# Patient Record
Sex: Male | Born: 1965 | Hispanic: No | Marital: Married | Smoking: Never smoker
Health system: Southern US, Community
[De-identification: ages and names within clinical notes are randomized; demographics above are authoritative.]

## PROBLEM LIST (undated history)

## (undated) DIAGNOSIS — R7303 Prediabetes: Principal | ICD-10-CM

## (undated) DIAGNOSIS — E785 Hyperlipidemia, unspecified: Secondary | ICD-10-CM

## (undated) DIAGNOSIS — J3089 Other allergic rhinitis: Secondary | ICD-10-CM

## (undated) DIAGNOSIS — K5790 Diverticulosis of intestine, part unspecified, without perforation or abscess without bleeding: Secondary | ICD-10-CM

## (undated) DIAGNOSIS — K648 Other hemorrhoids: Secondary | ICD-10-CM

## (undated) DIAGNOSIS — E669 Obesity, unspecified: Secondary | ICD-10-CM

## (undated) DIAGNOSIS — L209 Atopic dermatitis, unspecified: Secondary | ICD-10-CM

## (undated) HISTORY — DX: Other allergic rhinitis: J30.89

## (undated) HISTORY — DX: Hyperlipidemia, unspecified: E78.5

## (undated) HISTORY — DX: Other hemorrhoids: K64.8

## (undated) HISTORY — DX: Obesity, unspecified: E66.9

## (undated) HISTORY — DX: Atopic dermatitis, unspecified: L20.9

## (undated) HISTORY — DX: Prediabetes: R73.03

## (undated) HISTORY — DX: Diverticulosis of intestine, part unspecified, without perforation or abscess without bleeding: K57.90

---

## 2003-09-07 ENCOUNTER — Encounter: Admission: RE | Admit: 2003-09-07 | Discharge: 2003-09-07 | Payer: Self-pay | Admitting: Family Medicine

## 2003-11-09 ENCOUNTER — Ambulatory Visit: Payer: Self-pay | Admitting: Family Medicine

## 2011-02-07 DIAGNOSIS — R7303 Prediabetes: Secondary | ICD-10-CM

## 2011-02-07 HISTORY — DX: Prediabetes: R73.03

## 2011-08-01 ENCOUNTER — Encounter (HOSPITAL_COMMUNITY): Payer: Self-pay | Admitting: Emergency Medicine

## 2011-08-01 ENCOUNTER — Emergency Department (HOSPITAL_COMMUNITY)
Admission: EM | Admit: 2011-08-01 | Discharge: 2011-08-02 | Disposition: A | Discharge status: Home / Self Care | Payer: Self-pay | Attending: Emergency Medicine | Admitting: Emergency Medicine

## 2011-08-01 DIAGNOSIS — H5789 Other specified disorders of eye and adnexa: Secondary | ICD-10-CM | POA: Insufficient documentation

## 2011-08-01 DIAGNOSIS — T7840XA Allergy, unspecified, initial encounter: Secondary | ICD-10-CM | POA: Insufficient documentation

## 2011-08-01 DIAGNOSIS — L299 Pruritus, unspecified: Secondary | ICD-10-CM | POA: Insufficient documentation

## 2011-08-01 DIAGNOSIS — I10 Essential (primary) hypertension: Secondary | ICD-10-CM | POA: Insufficient documentation

## 2011-08-01 MED ORDER — DIPHENHYDRAMINE HCL 50 MG/ML IJ SOLN
50.0000 mg | Freq: Once | INTRAMUSCULAR | Status: AC
  Administered 2011-08-01: 50 mg via INTRAVENOUS
  Filled 2011-08-01: qty 1

## 2011-08-01 MED ORDER — EPINEPHRINE 0.3 MG/0.3ML IJ DEVI: 0.3000 mg | Freq: Once | INTRAMUSCULAR | Status: DC

## 2011-08-01 MED ORDER — FAMOTIDINE 20 MG PO TABS: 20.0000 mg | ORAL_TABLET | Freq: Two times a day (BID) | ORAL | Status: DC

## 2011-08-01 MED ORDER — PREDNISONE 10 MG PO TABS: 20.0000 mg | ORAL_TABLET | Freq: Every day | ORAL | Status: DC

## 2011-08-01 MED ORDER — METHYLPREDNISOLONE SODIUM SUCC 125 MG IJ SOLR
125.0000 mg | Freq: Once | INTRAMUSCULAR | Status: AC
  Administered 2011-08-01: 125 mg via INTRAVENOUS
  Filled 2011-08-01: qty 2

## 2011-08-01 MED ORDER — FAMOTIDINE IN NACL 20-0.9 MG/50ML-% IV SOLN
20.0000 mg | Freq: Once | INTRAVENOUS | Status: AC
  Administered 2011-08-01: 20 mg via INTRAVENOUS
  Filled 2011-08-01: qty 50

## 2011-08-01 NOTE — ED Provider Notes (Signed)
History     CSN: [id number]  Arrival date & time 08/01/11  2203   First MD Initiated Contact with Patient 08/01/11 2220      Chief Complaint  Patient presents with  . Allergic Reaction    (Consider location/radiation/quality/duration/timing/severity/associated sxs/prior treatment) Patient is a 46 y.o. male presenting with allergic reaction. The history is provided by the patient.  Allergic Reaction The primary symptoms do not include wheezing, shortness of breath, vomiting, rash or urticaria. The current episode started 1 to 2 hours ago. The problem has been rapidly worsening. This is a new problem.  Significant symptoms also include eye redness and itching.  no meds used possible to arrival, nothing makes sx better, no prior h/o same  History reviewed. No pertinent past medical history.  History reviewed. No pertinent past surgical history.  History reviewed. No pertinent family history.  History  Substance Use Topics  . Smoking status: Never Smoker   . Smokeless tobacco: Not on file  . Alcohol Use: Yes      Review of Systems  Eyes: Positive for redness.  Respiratory: Negative for shortness of breath and wheezing.   Gastrointestinal: Negative for vomiting.  Skin: Positive for itching. Negative for rash.  All other systems reviewed and are negative.    Allergies  Review of patient's allergies indicates no known allergies.  Home Medications   Current Outpatient Rx  Name Route Sig Dispense Refill  . ASPIRIN 325 MG PO TABS Oral Take 325 mg by mouth as needed.      BP 129/86  Pulse 71  Temp 98 F (36.7 C) (Oral)  Resp 16  SpO2 97%  Physical Exam  Nursing note and vitals reviewed. Constitutional: He is oriented to person, place, and time. He appears well-developed and well-nourished.  Non-toxic appearance. No distress.  HENT:  Head: Normocephalic and atraumatic.  Eyes: Conjunctivae, EOM and lids are normal. Pupils are equal, round, and reactive to light.         Periorbital edema  Neck: Normal range of motion. Neck supple. No tracheal deviation present. No mass present.  Cardiovascular: Normal rate, regular rhythm and normal heart sounds.  Exam reveals no gallop.   No murmur heard. Pulmonary/Chest: Effort normal and breath sounds normal. No stridor. No respiratory distress. He has no decreased breath sounds. He has no wheezes. He has no rhonchi. He has no rales.  Abdominal: Soft. Normal appearance and bowel sounds are normal. He exhibits no distension. There is no tenderness. There is no rebound and no CVA tenderness.  Musculoskeletal: Normal range of motion. He exhibits no edema and no tenderness.  Neurological: He is alert and oriented to person, place, and time. He has normal strength. No cranial nerve deficit or sensory deficit. GCS eye subscore is 4. GCS verbal subscore is 5. GCS motor subscore is 6.  Skin: Skin is warm and dry. No abrasion and no rash noted.  Psychiatric: He has a normal mood and affect. His speech is normal and behavior is normal.    ED Course  Procedures (including critical care time)  Labs Reviewed - No data to display No results found.   No diagnosis found.    MDM  Pt given benadryl , pepcid, steroids--eye swelling improved, will monitor and dr. Norlene Campbell to dispo  CRITICAL CARE Performed by: Toy Baker   Total critical care time: 60  Critical care time was exclusive of separately billable procedures and treating other patients.  Critical care was necessary to treat or prevent imminent  or life-threatening deterioration.  Critical care was time spent personally by me on the following activities: development of treatment plan with patient and/or surrogate as well as nursing, discussions with consultants, evaluation of patient's response to treatment, examination of patient, obtaining history from patient or surrogate, ordering and performing treatments and interventions, ordering and review of laboratory  studies, ordering and review of radiographic studies, pulse oximetry and re-evaluation of patient's condition.         Toy Baker, MD 08/01/11 [phone]

## 2011-08-01 NOTE — ED Notes (Signed)
Pt states he is having a possible allergic reaction to something  Pt states his sxs started about an hour ago  Pt has swelling noted to his eyelids and eyes, states he feels short of breath, hands and feet numb and tingling

## 2011-08-01 NOTE — ED Notes (Signed)
Pt resting in bed; no s/s of distress noted. Pt denies Pain at this time.

## 2011-08-01 NOTE — Discharge Instructions (Signed)
Reaccin alrgica - Leve a moderada (Allergic Reaction, Mild to Moderate) Las alergias pueden ser ocasionadas por cualquier cosa a la que su organismo sea sensible. Pueden ser alimentos, medicamentos, polen, sustancias qumicas y casi cualquiera de las cosas que lo rodean en su vida diaria que producen alrgenos. Un alrgeno es todo lo que hace que una sustancia produzca alergia. Esto hace que el organismo libere anticuerpos. A travs de una cadena de eventos, estos finalmente hacen que usted libere histamina en el torrente sanguneo. La funcin de la histamina es protegerlo, pero tambin puede causar [place]. Es por eso que en el caso de las alergias se [employer] antihistamnicos. La herencia es uno de los factores que interviene en las Therapist, art. Esto significa que usted puede sufrir alguna de las alergias que sufrieron sus [place]. Las eBay ocurrir a Actuary. Usted puede tener cierta idea de lo que ha causado la reaccin. Estamos rodeados por gran cantidad de alrgenos. Puede resultar difcil saber que caus su reaccin. Si este es Engineer, mining ataque, puede ser que no le vuelva a Radiation protection practitioner. Las alergias no pueden curarse pero pueden controlarse con medicamentos. SNTOMAS Una alergia puede causarle alguno o todos los siguientes sntomas.   Hinchazn y prurito en el interior de la boca y alrededor de la misma.   Lagrimeo y [employer] ojos.   Congestin nasal y goteo nasal.   Estornudos y tos.   Una erupcin roja que produce picazn o urticaria.   Vmitos o diarrea.   Dificultad para respirar.  Las Omnicom pueden ocurrir a Actuary. Se denominan as porque generalmente se producen durante la misma estacin todos los aos. Puede ser Neomia Dear reaccin al moho, al polen del csped o al polen de los rboles. Otras causas del problema son los alrgenos que contienen los caros del polvo del hogar, el pelaje de las mascotas y las esporas del moho. Estos  son slo algunos de los ms comunes de [employer] cientos de alrgenos que nos rodean. Todos estos sntomas aparecen cuando se entra en contacto con el polen y otros alrgenos. Estas alergias estacionales generalmente no ponen en peligro la vida. Generalmente se trata de una incomodidad que puede aliviarse con medicamentos. La fiebre del heno es una combinacin de todos o algunos de los problemas alrgicos enumerados. Simplemente se tratan con medicamentos de venta libre como difenhidramina (Benadryl). Tome los medicamentos segn las indicaciones. Consulte con el profesional que lo asiste o siga las instrucciones de uso para las dosis para nios. TRATAMIENTO Y CUIDADO DOMICILIARIO Si presenta urticaria o una erupcin cutnea:  Tome los medicamentos como se le indic.   Puede utilizar un antihistamnico de venta libre (difenhidramina) para la urticaria y Higher education careers adviser, segn sea necesario. No conduzca ni beba alcohol hasta que desaparezca el efecto de los medicamentos para Music therapist. Los antihistamnicos causan somnolencia.   Aplquese compresas fras sobre la piel o tome baos de agua fra. Ayuda a calmar la picazn. Evite los baos o las duchas calientes. El calor puede hacer que la urticaria y la picazn empeoren.   Si las Medical sales representative y se hacen ms graves, y los medicamentos de venta libre no son Geologist, engineering, existen muchos medicamentos nuevos que el mdico le puede prescribir Otros tratamientos como la inmunoterapia o las inyecciones desensibilizantes pueden utilizarse si todos estos fracasan. Haga una consulta de seguimiento con el profesional que lo asiste si los problemas continan.  SOLICITE ANTENCIN MDICA SI:  Las alergias se tornan cada  vez ms graves.   Sospecha que puede sufrir una alergia a Environmental manager. Los sntomas generalmente ocurren dentro de los 30 minutos posteriores a haber ingerido el alimento.   Los sntomas persistieron durante [address] o han empeorado.    Desarrolla nuevos sntomas.   Quiere volver a probar o que su hijo consuma nuevamente un alimento o bebida que usted cree que le causa una reaccin Counselling psychologist. Nunca realice una prueba en usted mismo, ni en su nio, si sospecha una alergia, si no se encuentra bajo la observacin de su mdico. Neomia Dear segunda exposicin a un alrgeno puede poner en peligro su vida.  SOLICITE ATENCIN MDICA DE INMEDIATO SI PRESENTA:  Dificultad para respirar, respiracin ruidosa o siente una sensacin de opresin en el pecho o en la garganta.   Presenta sarpullido, hinchazn o picazn en todo el cuerpo.  Una reaccin grave con cualquiera de los problemas mencionados debe considerarse como peligrosa para la vida. Si presenta dificultad para respirar de manera sbita, comunquese con el servicio de emergencias y solicite asistencia mdica. ESTO ES UNA EMERGENCIA. ASEGRESE QUE:   Comprende estas instrucciones.   Controlar su enfermedad.   Solicitar ayuda inmediatamente si no mejora o si empeora.  Document Released: 04/21/2008 Document Revised: 01/12/2011 [facility] Patient Information 2012 [place], [place].Inyeccin de epinefrina (Epinephrine Injection) La epinefrina es un medicamento que se administra de manera inyectable para el tratamiento de emergencia de Runner, broadcasting/film/video. Tambin se [place] para tratar los ataques de asma graves y otros problemas pulmonares. El medicamento ayuda a Physiological scientist (Clinical biochemist) las vas respiratorias pequeas de los pulmones. Una reaccin alrgica sbita que pone en peligro la vida y que involucra todo el cuerpo se llama anafilaxis. Debido a los McDonald[employer] Corporation, la epinefrina slo debe usarse segn las indicaciones del mdico.  RIESGOS Y COMPLICACIONES  Los efectos adversos posibles de la epinefrina son:   Journalist, newspaper.   Frecuencia cardaca rpida o irregular.   Falta de aire.   Nuseas.   Vmitos.   Dolor o clicos abdominales.   Sudoracin.   Mareos.    Debilidad.   Dolor de [place].   Nerviosismo.  Informe al mdico todos estos efectos secundarios.  CMO APLICAR LA INYECCIN DE EPINEFRINA  Administre la inyeccin de epinefrina de inmediato cuando comiencen los sntomas de una reaccin grave. El medicamento se inyecta en la cara externa del muslo o en cualquier msculo grande disponible. Su mdico podr ensearle cmo hacerlo. No es necesario que se quite la ropa. Despus de la inyeccin, llame a los servicios de emergencias (911 en los [place]). Aunque haya mejorado luego de la inyeccin, deber examinarse en el departamento de emergencias del hospital. La epinefrina acta rpidamente, pero tambin desaparece rpidamente. Pueden ocurrir reacciones tardas. Una reaccin retardada puede ser tan grave y peligrosa como la reaccin inicial.  INSTRUCCIONES PARA EL CUIDADO EN EL HOGAR   Asegrese de que usted y su familia saben cmo administrar una inyeccin de epinefrina.   Use un antihistamnico para evitar la picazn segn las indicaciones del mdico. No lo use con ms frecuencia ni en dosis mayores que las prescritas.   Lleve siempre la inyeccin de epinefrina o el kit de anafilaxis con usted. Esto podr salvarle la vida si tiene una reaccin grave.   Guarde el Careers adviser fresco y seco. Si cambia el color o est turbio deschelo de forma [place] y sustityalo por uno nuevo.   Verifique la fecha de vencimiento. Puede ser [place] medicamentos  ms all de su fecha de caducidad.   Comunque al mdico cualquier otro medicamento que est tomando. Algunos medicamentos pueden tener una reaccin adversa si se administran junto con la epinefrina.   Informe a su mdico [employer] sufre como diabetes, presin arterial alta (hipertensin), enfermedades del corazn, latidos cardacos irregulares, o si est embarazada.  SOLICITE ATENCIN MDICA DE INMEDIATO SI:   Se ha aplicado una inyeccin de epinefrina.  Comunquese inmediatamente con el servicio de urgencias de su localidad (911 en los [place]). Aunque haya mejorado luego de la inyeccin, deber examinarse en el departamento de emergencias del hospital para estar seguro de que la reaccin alrgica est bajo control. Tambin le [employer] adversos que pueda tener por el [place].   Siente dolor en el pecho.   La frecuencia cardaca est acelerada o es irregular.   Le falta el aire.   Siente dolor de cabeza intenso.   Tiene nuseas, vmitos o dolor abdominal intensos.   Siente mucho dolor, observa inflamacin o hinchazn en el sitio de la inyeccin.  Document Released: 01/23/2005 Document Revised: 01/12/2011 [facility] Patient Information 2012 [place], [place].

## 2015-06-07 ENCOUNTER — Encounter: Payer: Self-pay | Admitting: Internal Medicine

## 2015-06-07 ENCOUNTER — Ambulatory Visit (INDEPENDENT_AMBULATORY_CARE_PROVIDER_SITE_OTHER): Payer: Self-pay | Admitting: Internal Medicine

## 2015-06-07 VITALS — BP 112/72 | HR 56 | Resp 18 | Ht 63.0 in | Wt 174.0 lb

## 2015-06-07 DIAGNOSIS — R7303 Prediabetes: Secondary | ICD-10-CM

## 2015-06-07 NOTE — Patient Instructions (Signed)
When you wash your hands, reapply Triamcinolone Cream to rash and Eucerin cream with eczema relief all over hands.  You should use the Triamcinolone cream and eucerin cream twice daily no matter what while you have the rash.  Also recommend the last time you use your hand creams at night, you put the cotton gloves over your hands and sleep with them on.  Recommend wearing the cotton gloves under the nitrile gloves while you clean.

## 2015-06-07 NOTE — Progress Notes (Signed)
   Subjective:    Patient ID: Dennis Booth, male    DOB: 10/25/1965, 50 y.o.   MRN: [id number]  HPI   1.  Flaking and thickening of skin of bilateral hands.  Left hand with involvement between 4th and 5th MCPs and dorsal aspect of web between thmb and index finger on right.  Has had for years.  Has been treated with Triamcinolone ointment and Fluocinonide cream.  Did not help at all.   Works in Pensions consultantcleaning service for businesses.  Wears latex gloves. Uses chemicals to clean, but states he does not get his hands in these chemicals.  Has had rash on hands for 4 years.  Itches.   Uses unknown soap.  Then applies hand sanitizer.  Then pulls latex gloves over his hands.    No current outpatient prescriptions on file.   Allergies  Allergen Reactions  . Fruit & Vegetable Daily [Nutritional Supplements]     States when he eats any type of fruit and takes Tylenol at same time, gets irritation in his throat--drinks milk and gets better, but was prescribed Epipen for this in past--not clear why.    Past Medical History  Diagnosis Date  . Prediabetes 2013    No past surgical history on file.   Family History  Problem Relation Age of Onset  . Diabetes Mother   . Diabetes Father     Social History   Social History  . Marital Status: Legally Separated    Spouse Name: N/A  . Number of Children: 4  . Years of Education: 7   Occupational History  . Cleaning crew for businesses    Social History Main Topics  . Smoking status: Never Smoker   . Smokeless tobacco: Never Used  . Alcohol Use: 0.0 oz/week    0 Standard drinks or equivalent per week     Comment: Has not had any alcohol for 3 years.  . Drug Use: No  . Sexual Activity: Not on file   Other Topics Concern  . Not on file   Social History Narrative   Originally from [place]   [place]   Lives by himself.   Children still at home live with their mother.        Review of Systems     Objective:   Physical Exam Lungs: CTA CV:  RRR without murmur or rub, occasional extrasystole, radial pulses normal and equal Skin:  Dorsi of bilateral hands thickened and scattered flaking. Large patch on left hand between MCPs of 4th and 5th digits, and in web between thumb and index finger on right.  Patches thickened and scaly.  Mild erythema and scaling at antecubital fossa on left.       Assessment & Plan:  1.  Dyshidrotic Eczema:  To use Dove or similar soap, no alcohol to skin.  Apply Triamcinolone Cream to patches at least twice daily or when washes hands, then Eucerin Cream all over hands. To wear thin white cotton gloves over hands and under Nitrile gloves and avoid latex  To also wear thin white gloves over creams when goes to bed.  Return for CPE in 3 months.

## 2015-06-18 ENCOUNTER — Telehealth: Payer: Self-pay | Admitting: Internal Medicine

## 2015-06-18 NOTE — Telephone Encounter (Signed)
Patient called requesting refill for Triamcinolone Acetonide 0.1% ointment. [facility] [address]

## 2015-06-22 MED ORDER — TRIAMCINOLONE ACETONIDE 0.1 % EX CREA: 1.0000 "application " | TOPICAL_CREAM | Freq: Two times a day (BID) | CUTANEOUS | Status: DC

## 2015-06-23 NOTE — Telephone Encounter (Signed)
Patient notified

## 2015-06-24 ENCOUNTER — Other Ambulatory Visit: Payer: Self-pay | Admitting: Internal Medicine

## 2015-06-24 ENCOUNTER — Telehealth: Payer: Self-pay | Admitting: Internal Medicine

## 2015-06-24 MED ORDER — TRIAMCINOLONE ACETONIDE 0.1 % EX CREA: 1.0000 "application " | TOPICAL_CREAM | Freq: Two times a day (BID) | CUTANEOUS | Status: DC

## 2015-07-01 NOTE — Telephone Encounter (Signed)
Patient picked up Triamcinolone already.

## 2015-09-07 ENCOUNTER — Encounter: Payer: Self-pay | Admitting: Internal Medicine

## 2015-09-07 ENCOUNTER — Ambulatory Visit (INDEPENDENT_AMBULATORY_CARE_PROVIDER_SITE_OTHER): Payer: Self-pay | Admitting: Internal Medicine

## 2015-09-07 VITALS — BP 130/82 | HR 50 | Resp 16 | Ht 64.0 in | Wt 177.0 lb

## 2015-09-07 DIAGNOSIS — K029 Dental caries, unspecified: Secondary | ICD-10-CM

## 2015-09-07 DIAGNOSIS — Z0000 Encounter for general adult medical examination without abnormal findings: Secondary | ICD-10-CM

## 2015-09-07 DIAGNOSIS — R7303 Prediabetes: Secondary | ICD-10-CM

## 2015-09-07 DIAGNOSIS — L301 Dyshidrosis [pompholyx]: Secondary | ICD-10-CM

## 2015-09-07 LAB — GLUCOSE, POCT (MANUAL RESULT ENTRY): POC Glucose: 78 mg/dl (ref 70–99)

## 2015-09-07 NOTE — Patient Instructions (Addendum)
Call the clinic if you do not hear from dental or dermatology in next month Call in September for Influenza and Tdap vaccines

## 2015-09-07 NOTE — Progress Notes (Signed)
Subjective:    Patient ID: Dennis Booth, male    DOB: 08-Jun-1965, 50 y.o.   MRN: [id number]  HPI   Here for CPE  Health Maintenance:  1.  PSA:  Not sure if ever done.  No family history of prostate cancer.  No history of DRE.  2.  Guaiac Cards:  Never  3.  Colonoscopy:  Never  4.  STE:  Never  5.  Immunizations:  No history of influenza vaccine.  No Td in past 10 years.    Dishydrotic Eczema:  Did not get adequate response to using Triamcinolone cream and Eucerin cream twice daily with cotton gloves after applying at night. Went to [facility] clinic and received unknown topical that was quite expensive.  Did not help as well.  No exposure to water and cleaning chemicals now with job.   Current Outpatient Prescriptions:  .  triamcinolone cream (KENALOG) 0.1 %, Apply 1 application topically 2 (two) times daily., Disp: 80 g, Rfl: 0   Allergies  Allergen Reactions  . Fruit & Vegetable Daily [Nutritional Supplements]     States when he eats any type of fruit and takes Tylenol at same time, gets irritation in his throat--drinks milk and gets better, but was prescribed Epipen for this in past--not clear why.   Past Medical History:  Diagnosis Date  . Prediabetes 2013    No past surgical history on file.   Family History  Problem Relation Age of Onset  . Diabetes Mother   . Diabetes Father     Social History   Social History  . Marital status: Legally Separated    Spouse name: N/A  . Number of children: 4  . Years of education: 7   Occupational History  . Cleaning crew for businesses    Social History Main Topics  . Smoking status: Never Smoker  . Smokeless tobacco: Never Used  . Alcohol use 0.0 oz/week     Comment: Has not had any alcohol for 3 years.  . Drug use: No  . Sexual activity: Not on file   Other Topics Concern  . Not on file   Social History Narrative   Originally from [place]   [place]   Lives by himself.   Children still at home live with their mother.       Review of Systems  Constitutional: Negative for fatigue and fever.  HENT: Negative for ear pain, hearing loss, postnasal drip and sore throat.   Eyes:       Uses reading glasses--works well for him  Respiratory: Negative for chest tightness and shortness of breath.   Cardiovascular: Negative for chest pain, palpitations and leg swelling.  Gastrointestinal: Negative for abdominal pain, blood in stool, constipation, diarrhea and nausea.       No melena   Endocrine: Negative for polydipsia and polyuria.  Genitourinary: Negative for decreased urine volume, discharge, dysuria, frequency, penile pain, scrotal swelling and testicular pain.  Musculoskeletal: Negative for arthralgias, gait problem, joint swelling and myalgias.  Skin: Positive for rash.       dishydrotic eczema still a problem.  [facility] [address] and got another cream that was expensive that did not help.  Cannot recall the name.  Allergic/Immunologic: Positive for food allergies.       [place]  Neurological: Negative for dizziness, weakness, light-headedness, numbness and headaches.  Hematological: Negative for adenopathy. Does not bruise/bleed easily.  Psychiatric/Behavioral: Negative for dysphoric mood, sleep disturbance  and suicidal ideas. The patient is not nervous/anxious.        Objective:   Physical Exam  Constitutional: He is oriented to person, place, and time. He appears well-developed and well-nourished.  HENT:  Head: Normocephalic and atraumatic.  Right Ear: Hearing, tympanic membrane, external ear and ear canal normal.  Left Ear: Hearing, tympanic membrane, external ear and ear canal normal.  Nose: Nose normal.  Mouth/Throat: Uvula is midline, oropharynx is clear and moist and mucous membranes are normal.  Missing teeth.  Some decay  Eyes: Conjunctivae and EOM are normal. Pupils are equal, round, and reactive to light.  Discs sharp  bilaterally  Neck: Normal range of motion and full passive range of motion without pain. Neck supple. No thyroid mass and no thyromegaly present.  Cardiovascular: Normal rate, regular rhythm, S1 normal, S2 normal, normal heart sounds and normal pulses.  Exam reveals no S3, no S4 and no friction rub.   No murmur heard. Pulmonary/Chest: Effort normal and breath sounds normal.  Abdominal: Soft. Bowel sounds are normal. He exhibits no mass. There is no hepatosplenomegaly. There is no tenderness. No hernia.  Genitourinary:  Genitourinary Comments: Patient refused genital and rectal/prostate exam  Musculoskeletal: Normal range of motion.  Lymphadenopathy:       Head (right side): No submental and no submandibular adenopathy present.       Head (left side): No submental and no submandibular adenopathy present.    He has no cervical adenopathy.    He has no axillary adenopathy.       Right: No inguinal and no supraclavicular adenopathy present.       Left: No inguinal and no supraclavicular adenopathy present.  Neurological: He is alert and oriented to person, place, and time. He has normal strength and normal reflexes. He displays normal reflexes. No cranial nerve deficit or sensory deficit. Coordination and gait normal.  Skin: Skin is warm and dry.  Dry flaking skin on dorsum of MCP area of 4th and 5th fingers of left hand and dorsum of 1st web and index MCP on right.  Hypopigmentation in same area.  Psychiatric: He has a normal mood and affect. His speech is normal and behavior is normal. Judgment and thought content normal.          Assessment & Plan:  1.  Annual Exam Patient refused genital and rectal/prostate eam Needs Tdap--out today and will have available shortly--will notify patient Recommend calling in September for Influenza vaccine CBC, CMP, FLP  2.  Prediabetes:  A1C  3.  Dental decay:  Dental referral  4.  Dishydrotic eczema:  Has not responded to topical treatments,  though I am not certain of what cream was prescribed at [facility] clinic.  Derm referral.

## 2015-09-08 LAB — COMPREHENSIVE METABOLIC PANEL
ALBUMIN: 4 g/dL (ref 3.5–5.5)
ALT: 22 IU/L (ref 0–44)
AST: 15 IU/L (ref 0–40)
Albumin/Globulin Ratio: 1.4 (ref 1.2–2.2)
Alkaline Phosphatase: 66 IU/L (ref 39–117)
BUN / CREAT RATIO: 16 (ref 9–20)
BUN: 12 mg/dL (ref 6–24)
Bilirubin Total: 0.3 mg/dL (ref 0.0–1.2)
CALCIUM: 8.9 mg/dL (ref 8.7–10.2)
CO2: 25 mmol/L (ref 18–29)
CREATININE: 0.75 mg/dL — AB (ref 0.76–1.27)
Chloride: 102 mmol/L (ref 96–106)
GFR calc Af Amer: 125 mL/min/{1.73_m2} (ref >59)
GFR, EST NON AFRICAN AMERICAN: 108 mL/min/{1.73_m2} (ref >59)
GLOBULIN, TOTAL: 2.8 g/dL (ref 1.5–4.5)
Glucose: 100 mg/dL — ABNORMAL HIGH (ref 65–99)
Potassium: 4.1 mmol/L (ref 3.5–5.2)
SODIUM: 140 mmol/L (ref 134–144)
TOTAL PROTEIN: 6.8 g/dL (ref 6.0–8.5)

## 2015-09-08 LAB — CBC WITH DIFFERENTIAL/PLATELET
BASOS: 0 %
Basophils Absolute: 0 10*3/uL (ref 0.0–0.2)
EOS (ABSOLUTE): 0.1 10*3/uL (ref 0.0–0.4)
EOS: 2 %
HEMATOCRIT: 41.5 % (ref 37.5–51.0)
Hemoglobin: 13.9 g/dL (ref 12.6–17.7)
IMMATURE GRANS (ABS): 0 10*3/uL (ref 0.0–0.1)
IMMATURE GRANULOCYTES: 0 %
LYMPHS: 36 %
Lymphocytes Absolute: 1.8 10*3/uL (ref 0.7–3.1)
MCH: 30.2 pg (ref 26.6–33.0)
MCHC: 33.5 g/dL (ref 31.5–35.7)
MCV: 90 fL (ref 79–97)
MONOS ABS: 0.4 10*3/uL (ref 0.1–0.9)
Monocytes: 7 %
NEUTROS PCT: 55 %
Neutrophils Absolute: 2.7 10*3/uL (ref 1.4–7.0)
PLATELETS: 209 10*3/uL (ref 150–379)
RBC: 4.6 x10E6/uL (ref 4.14–5.80)
RDW: 13.7 % (ref 12.3–15.4)
WBC: 5.1 10*3/uL (ref 3.4–10.8)

## 2015-09-08 LAB — LIPID PANEL W/O CHOL/HDL RATIO
Cholesterol, Total: 161 mg/dL (ref 100–199)
HDL: 50 mg/dL (ref >39)
LDL Calculated: 79 mg/dL (ref 0–99)
TRIGLYCERIDES: 161 mg/dL — AB (ref 0–149)
VLDL CHOLESTEROL CAL: 32 mg/dL (ref 5–40)

## 2015-09-08 LAB — HGB A1C W/O EAG: HEMOGLOBIN A1C: 5.7 % — AB (ref 4.8–5.6)

## 2015-09-15 ENCOUNTER — Ambulatory Visit (INDEPENDENT_AMBULATORY_CARE_PROVIDER_SITE_OTHER): Payer: Self-pay

## 2015-09-15 DIAGNOSIS — Z23 Encounter for immunization: Secondary | ICD-10-CM

## 2015-09-24 ENCOUNTER — Other Ambulatory Visit (INDEPENDENT_AMBULATORY_CARE_PROVIDER_SITE_OTHER): Payer: Self-pay | Admitting: Internal Medicine

## 2015-09-24 DIAGNOSIS — Z1211 Encounter for screening for malignant neoplasm of colon: Secondary | ICD-10-CM

## 2015-09-24 LAB — POC HEMOCCULT BLD/STL (HOME/3-CARD/SCREEN)
Card #3 Fecal Occult Blood, POC: NEGATIVE
FECAL OCCULT BLD: NEGATIVE
Fecal Occult Blood, POC: NEGATIVE

## 2015-09-24 NOTE — Progress Notes (Signed)
Patient stool cards resulted and sent to PCP

## 2015-11-23 ENCOUNTER — Encounter: Payer: Self-pay | Admitting: Internal Medicine

## 2016-03-31 ENCOUNTER — Ambulatory Visit (INDEPENDENT_AMBULATORY_CARE_PROVIDER_SITE_OTHER): Payer: Self-pay | Admitting: Internal Medicine

## 2016-03-31 ENCOUNTER — Encounter: Payer: Self-pay | Admitting: Internal Medicine

## 2016-03-31 VITALS — BP 126/72 | HR 70 | Resp 12 | Ht 63.0 in | Wt 180.0 lb

## 2016-03-31 DIAGNOSIS — K047 Periapical abscess without sinus: Secondary | ICD-10-CM

## 2016-03-31 DIAGNOSIS — K068 Other specified disorders of gingiva and edentulous alveolar ridge: Secondary | ICD-10-CM

## 2016-03-31 MED ORDER — PENICILLIN V POTASSIUM 250 MG PO TABS: 250.0000 mg | ORAL_TABLET | Freq: Four times a day (QID) | ORAL | 0 refills | Status: DC

## 2016-03-31 MED ORDER — IBUPROFEN 200 MG PO TABS: ORAL_TABLET | ORAL | 0 refills | Status: DC

## 2016-03-31 NOTE — Progress Notes (Signed)
   Subjective:    Patient ID: Janeal HolmesRene Pedraza-Hernandez, male    DOB: 03/15/1965, 51 y.o.   MRN: [id number]  HPI   Pain around tooth in left upper posterior jaw.  No definite drainage or swelling.  No fever Taking unknown OTC pain medication.  Current Meds  Medication Sig  . triamcinolone cream (KENALOG) 0.1 % Apply 1 application topically 2 (two) times daily.   Allergies  Allergen Reactions  . Fruit & Vegetable Daily [Nutritional Supplements]     States when he eats any type of fruit and takes Tylenol at same time, gets irritation in his throat--drinks milk and gets better, but was prescribed Epipen for this in past--not clear why.    Review of Systems     Objective:   Physical Exam  NAD Back two molars in left upper jaw with inflamed gingiva on palate side gingiva.  The second molar with almost ulceration and sharp recession of gingiva along palate side gingiva.  No fluctuance noted.  Buccal side gingiva with recession but not significant inflammation. Neck:  Supple, and without adenopathy Chest:  CTA CV:  RRR without murmur or rub, radial pulses normal and equal        Assessment & Plan:  Abscessed tooth, left upper posterior molars:  Concerned with the look of the gingiva on palate side associated with back two molars.   Start Pen V K 250 mg 4 times daily for 7 days and follow up in 1 week.   Will need dental referral as well.

## 2016-03-31 NOTE — Patient Instructions (Signed)
Anbesol --place on gum where pain is as needed

## 2016-04-07 ENCOUNTER — Encounter: Payer: Self-pay | Admitting: Internal Medicine

## 2016-04-07 ENCOUNTER — Ambulatory Visit (INDEPENDENT_AMBULATORY_CARE_PROVIDER_SITE_OTHER): Payer: Self-pay | Admitting: Internal Medicine

## 2016-04-07 VITALS — BP 112/72 | HR 68 | Resp 12 | Ht 63.0 in | Wt 180.0 lb

## 2016-04-07 DIAGNOSIS — K068 Other specified disorders of gingiva and edentulous alveolar ridge: Secondary | ICD-10-CM

## 2016-04-07 DIAGNOSIS — K047 Periapical abscess without sinus: Secondary | ICD-10-CM

## 2016-04-07 MED ORDER — CLINDAMYCIN HCL 150 MG PO CAPS: ORAL_CAPSULE | ORAL | 0 refills | Status: DC

## 2016-04-07 NOTE — Patient Instructions (Signed)
Avoid chewing on right side of mouth. Gargle with Listerine after each meal/snack

## 2016-04-07 NOTE — Progress Notes (Signed)
   Subjective:    Patient ID: Dennis HolmesRene Booth, male    DOB: 05/16/1965, 51 y.o.   MRN: [id number]  HPI   Here for follow up of abscessed tooth, left upper molars and ulcered, inflamed gingiva on palate side of teeth. Is on last day of Penicillin.  Took his last dose this morning.  States he is feeling much better--able to chew on the right again without pain.  No drainage from about the teeth.  Current Meds  Medication Sig  . ibuprofen (ADVIL,MOTRIN) 200 MG tablet 2-4 tabs by mouth every 6 hours with food for pain  . penicillin v potassium (VEETID) 250 MG tablet Take 1 tablet (250 mg total) by mouth 4 (four) times daily.  Marland Kitchen. triamcinolone cream (KENALOG) 0.1 % Apply 1 application topically 2 (two) times daily.   Allergies  Allergen Reactions  . Fruit & Vegetable Daily [Nutritional Supplements]     States when he eats any type of fruit and takes Tylenol at same time, gets irritation in his throat--drinks milk and gets better, but was prescribed Epipen for this in past--not clear why.    Review of Systems     Objective:   Physical Exam  NAD  Dental:  Two posterior right upper molars still with ulceration and what appears to be more inflammation of gingiva on palate side of teeth.  Pressure on palate just medial to teeth results in thick yellow pus expressed around medial second molar and some bleeding.  Patient states this is nontender and has no taste.  Unable to get swab of pus  NecK:  Supple, no adenopathy        Assessment & Plan:  Abscessed teeth, right upper molars:  Switch to Clindamycin 300 mg 3 times daily for 7 days.  To call if develops diarrhea.   Check on dental referral. Avoid chewing on right side

## 2016-04-14 ENCOUNTER — Ambulatory Visit (INDEPENDENT_AMBULATORY_CARE_PROVIDER_SITE_OTHER): Payer: Self-pay | Admitting: Internal Medicine

## 2016-04-14 ENCOUNTER — Encounter: Payer: Self-pay | Admitting: Internal Medicine

## 2016-04-14 VITALS — BP 118/80 | HR 70 | Resp 12 | Ht 63.0 in | Wt 179.0 lb

## 2016-04-14 DIAGNOSIS — K047 Periapical abscess without sinus: Secondary | ICD-10-CM

## 2016-04-14 DIAGNOSIS — H547 Unspecified visual loss: Secondary | ICD-10-CM

## 2016-04-14 MED ORDER — CLINDAMYCIN HCL 150 MG PO CAPS: ORAL_CAPSULE | ORAL | 0 refills | Status: DC

## 2016-04-14 NOTE — Patient Instructions (Signed)
Call clinic if develop diarrhea. Call if you don't hear from dental clinic next week.

## 2016-04-14 NOTE — Progress Notes (Signed)
   Subjective:    Patient ID: Dennis HolmesRene Booth, male    DOB: 07/01/1965, 51 y.o.   MRN: [id number]  HPI   Abscessed Teeth:  Has been on Clindamycin and states he notes a big difference from his last treatment.  Has not noted any drainage from about the left upper molars.  No fever.  Pain is resolved, which he stated despite the obvious infection last week. No diarrhea.     Also, decreased visual acuity, would like eye check to see about getting prescription glasses as readers not working for him as well as he would like.    Current Meds  Medication Sig  . clindamycin (CLEOCIN) 150 MG capsule 2 caps by mouth 3 times daily for 7 days.  Marland Kitchen. triamcinolone cream (KENALOG) 0.1 % Apply 1 application topically 2 (two) times daily.      Allergies  Allergen Reactions  . Fruit & Vegetable Daily [Nutritional Supplements]     States when he eats any type of fruit and takes Tylenol at same time, gets irritation in his throat--drinks milk and gets better, but was prescribed Epipen for this in past--not clear why.      Review of Systems     Objective:   Physical Exam   Swelling and fluctuance of left hard palate near left upper molars much decreased, but still with some pustular discharge with pressure of gingiva near involved teeth, along with scant bloody discharge.  Erythema of gingiva significantly decreased as well.  Eroded gingiva mildly improved.  Denies tenderness with the manipulation of the area as befofe.        Assessment & Plan:  1.  Abscessed Tooth:  Extend antibiotic treatment to 12 days with follow up next week.  May cancel follow up if gets into the dental clinic.  Call if develops diarrhea/abdominal pain  2. Decreased Visual Acuity:  Optometry referral okay with $69 plus eyeglass voucher

## 2016-04-20 ENCOUNTER — Ambulatory Visit (INDEPENDENT_AMBULATORY_CARE_PROVIDER_SITE_OTHER): Payer: Self-pay | Admitting: Internal Medicine

## 2016-04-20 ENCOUNTER — Encounter: Payer: Self-pay | Admitting: Internal Medicine

## 2016-04-20 VITALS — BP 122/72 | HR 60 | Resp 12 | Ht 63.0 in | Wt 179.0 lb

## 2016-04-20 DIAGNOSIS — K047 Periapical abscess without sinus: Secondary | ICD-10-CM

## 2016-04-20 NOTE — Progress Notes (Signed)
   Subjective:    Patient ID: Dennis HolmesRene Booth, male    DOB: 03/03/1965, 51 y.o.   MRN: [id number]  HPI  Tooth abscess:  Feels he is better.  Has 3 more days of Clindamycin. Has not noted any drainage of pus or blood.  No diarrhea.   Current Meds  Medication Sig  . clindamycin (CLEOCIN) 150 MG capsule 2 caps by mouth 3 times daily for additional 5 more days.  Marland Kitchen. triamcinolone cream (KENALOG) 0.1 % Apply 1 application topically 2 (two) times daily.   Allergies  Allergen Reactions  . Fruit & Vegetable Daily [Nutritional Supplements]     States when he eats any type of fruit and takes Tylenol at same time, gets irritation in his throat--drinks milk and gets better, but was prescribed Epipen for this in past--not clear why.    Review of Systems     Objective:   Physical Exam  Erythema of gingiva around back two upper right molars is much less.  A small area on the palate side of these molars when pressed produces a scant amount of blood and pus--much less than before.  NT.        Assessment & Plan:  Abscessed right upper molars:  Asked him to start flossing, especially about the two teeth in question.  Needs Dental Clinic evaluation and management.  To call if worsens.

## 2016-07-27 ENCOUNTER — Telehealth: Payer: Self-pay | Admitting: Internal Medicine

## 2016-07-27 NOTE — Telephone Encounter (Signed)
Please call and schedule for monday

## 2016-07-31 ENCOUNTER — Ambulatory Visit (INDEPENDENT_AMBULATORY_CARE_PROVIDER_SITE_OTHER): Payer: Self-pay | Admitting: Internal Medicine

## 2016-07-31 ENCOUNTER — Encounter: Payer: Self-pay | Admitting: Internal Medicine

## 2016-07-31 ENCOUNTER — Ambulatory Visit: Payer: Self-pay | Admitting: Internal Medicine

## 2016-07-31 VITALS — BP 122/70 | HR 84 | Resp 12 | Ht 63.0 in | Wt 180.0 lb

## 2016-07-31 DIAGNOSIS — K625 Hemorrhage of anus and rectum: Secondary | ICD-10-CM

## 2016-08-01 LAB — CBC WITH DIFFERENTIAL/PLATELET
BASOS ABS: 0 10*3/uL (ref 0.0–0.2)
BASOS: 1 %
EOS (ABSOLUTE): 0.1 10*3/uL (ref 0.0–0.4)
EOS: 3 %
HEMATOCRIT: 39.8 % (ref 37.5–51.0)
HEMOGLOBIN: 13.2 g/dL (ref 13.0–17.7)
Immature Grans (Abs): 0 10*3/uL (ref 0.0–0.1)
Immature Granulocytes: 0 %
LYMPHS ABS: 1.8 10*3/uL (ref 0.7–3.1)
LYMPHS: 37 %
MCH: 30.4 pg (ref 26.6–33.0)
MCHC: 33.2 g/dL (ref 31.5–35.7)
MCV: 92 fL (ref 79–97)
Monocytes Absolute: 0.4 10*3/uL (ref 0.1–0.9)
Monocytes: 9 %
NEUTROS ABS: 2.4 10*3/uL (ref 1.4–7.0)
Neutrophils: 50 %
Platelets: 208 10*3/uL (ref 150–379)
RBC: 4.34 x10E6/uL (ref 4.14–5.80)
RDW: 14 % (ref 12.3–15.4)
WBC: 4.8 10*3/uL (ref 3.4–10.8)

## 2016-09-17 NOTE — Progress Notes (Signed)
   Subjective:    Patient ID: Dennis HolmesRene Pedraza-Hernandez, male    DOB: 11/22/1965, 51 y.o.   MRN: [id number]  HPI   This note transcribed from written record by Dr. Rodney Cruisearey Westermann.  See scanned written note in media  Blood in stool x 2 weeks.  Had before, but only for a couple of days.  Normal daily BM's, but BR blood on TP.  Otherwise feels fine.  C/o 2 wks of BRBPR following his usual normal daily BM.  Blood on TP and in toilet bowl.  Otherwise feeling fine-no pain with BM, diarrhea or constipation.  No other bleeding or bruising.  No outpatient prescriptions have been marked as taking for the 07/31/16 encounter (Office Visit) with Julieanne MansonMulberry, Ailyn Gladd, MD.    Allergies  Allergen Reactions  . Fruit & Vegetable Daily [Nutritional Supplements]     States when he eats any type of fruit and takes Tylenol at same time, gets irritation in his throat--drinks milk and gets better, but was prescribed Epipen for this in past--not clear why.        Review of Systems     Objective:   Physical Exam Skin:  Nl HEENT: Nl.  Conjunctivae pink, sclera anicteric Neck:  Supple, No adenopathy Chest:  Lungs clear CV:  RRR No murmur Abdomen/pelvis:  Sof, NT, Normal BS's, No masses.  Rectal - nl sph. Tone.  + heme stool.  No masses/NT Extremities:  No edema.       Assessment & Plan:  Rectal Bleeding:  CBC today.  Refer for colonoscopy-semi urgent (almost 51 so will need one anyway) If an increase in bleeding, feel dizzy, short of breath, then go to an ER. Try increasing fiber, or add metamucil once a day. Call if still bleeding in 1 wk. Avoid aspirin NSAIDS

## 2017-09-06 ENCOUNTER — Encounter: Payer: Self-pay | Admitting: Internal Medicine

## 2017-09-06 ENCOUNTER — Ambulatory Visit: Payer: Self-pay | Admitting: Internal Medicine

## 2017-09-06 VITALS — BP 122/82 | HR 78 | Resp 12 | Ht 63.0 in | Wt 178.0 lb

## 2017-09-06 DIAGNOSIS — R7303 Prediabetes: Secondary | ICD-10-CM

## 2017-09-06 DIAGNOSIS — K625 Hemorrhage of anus and rectum: Secondary | ICD-10-CM

## 2017-09-07 LAB — CBC WITH DIFFERENTIAL/PLATELET
BASOS ABS: 0 10*3/uL (ref 0.0–0.2)
Basos: 0 %
EOS (ABSOLUTE): 0.1 10*3/uL (ref 0.0–0.4)
Eos: 2 %
HEMOGLOBIN: 13.7 g/dL (ref 13.0–17.7)
Hematocrit: 41.6 % (ref 37.5–51.0)
Immature Grans (Abs): 0 10*3/uL (ref 0.0–0.1)
Immature Granulocytes: 0 %
LYMPHS ABS: 1.7 10*3/uL (ref 0.7–3.1)
Lymphs: 28 %
MCH: 31.1 pg (ref 26.6–33.0)
MCHC: 32.9 g/dL (ref 31.5–35.7)
MCV: 95 fL (ref 79–97)
MONOCYTES: 6 %
MONOS ABS: 0.3 10*3/uL (ref 0.1–0.9)
Neutrophils Absolute: 3.9 10*3/uL (ref 1.4–7.0)
Neutrophils: 64 %
Platelets: 213 10*3/uL (ref 150–450)
RBC: 4.4 x10E6/uL (ref 4.14–5.80)
RDW: 14 % (ref 12.3–15.4)
WBC: 6.2 10*3/uL (ref 3.4–10.8)

## 2017-09-07 LAB — HGB A1C W/O EAG: HEMOGLOBIN A1C: 5.9 % — AB (ref 4.8–5.6)

## 2017-10-24 ENCOUNTER — Other Ambulatory Visit: Payer: Self-pay | Admitting: Gastroenterology

## 2017-11-27 ENCOUNTER — Ambulatory Visit (HOSPITAL_COMMUNITY): Payer: Self-pay | Admitting: Anesthesiology

## 2017-11-27 ENCOUNTER — Encounter (HOSPITAL_COMMUNITY)
Admission: RE | Disposition: A | Discharge status: Home / Self Care | Payer: Self-pay | Source: Ambulatory Visit | Attending: Gastroenterology

## 2017-11-27 ENCOUNTER — Other Ambulatory Visit: Payer: Self-pay

## 2017-11-27 ENCOUNTER — Encounter (HOSPITAL_COMMUNITY): Payer: Self-pay | Admitting: Emergency Medicine

## 2017-11-27 ENCOUNTER — Ambulatory Visit (HOSPITAL_COMMUNITY)
Admission: RE | Admit: 2017-11-27 | Discharge: 2017-11-27 | Disposition: A | Discharge status: Home / Self Care | Payer: Self-pay | Source: Ambulatory Visit | Attending: Gastroenterology | Admitting: Gastroenterology

## 2017-11-27 DIAGNOSIS — K625 Hemorrhage of anus and rectum: Secondary | ICD-10-CM | POA: Insufficient documentation

## 2017-11-27 DIAGNOSIS — K648 Other hemorrhoids: Secondary | ICD-10-CM | POA: Insufficient documentation

## 2017-11-27 DIAGNOSIS — K5790 Diverticulosis of intestine, part unspecified, without perforation or abscess without bleeding: Secondary | ICD-10-CM

## 2017-11-27 DIAGNOSIS — K5730 Diverticulosis of large intestine without perforation or abscess without bleeding: Secondary | ICD-10-CM | POA: Insufficient documentation

## 2017-11-27 HISTORY — PX: COLONOSCOPY WITH PROPOFOL: SHX5780

## 2017-11-27 HISTORY — DX: Diverticulosis of intestine, part unspecified, without perforation or abscess without bleeding: K57.90

## 2017-11-27 HISTORY — DX: Other hemorrhoids: K64.8

## 2017-11-27 SURGERY — COLONOSCOPY WITH PROPOFOL
Anesthesia: Monitor Anesthesia Care

## 2017-11-27 MED ORDER — PROPOFOL 10 MG/ML IV BOLUS
INTRAVENOUS | Status: AC
  Filled 2017-11-27: qty 40

## 2017-11-27 MED ORDER — PROPOFOL 10 MG/ML IV BOLUS
INTRAVENOUS | Status: DC | PRN
  Administered 2017-11-27: 20 mg via INTRAVENOUS
  Administered 2017-11-27 (×2): 40 mg via INTRAVENOUS
  Administered 2017-11-27: 30 mg via INTRAVENOUS
  Administered 2017-11-27 (×3): 40 mg via INTRAVENOUS

## 2017-11-27 MED ORDER — LACTATED RINGERS IV SOLN
INTRAVENOUS | Status: DC | PRN
  Administered 2017-11-27: 13:00:00 via INTRAVENOUS

## 2017-11-27 MED ORDER — LIDOCAINE 2% (20 MG/ML) 5 ML SYRINGE
INTRAMUSCULAR | Status: DC | PRN
  Administered 2017-11-27: 40 mg via INTRAVENOUS
  Administered 2017-11-27: 60 mg via INTRAVENOUS

## 2017-11-27 SURGICAL SUPPLY — 21 items

## 2017-11-27 NOTE — H&P (Signed)
Patient here for colonoscopy for rectal bleeding. See H and P. No change in history. PE no change. ZOX:WRUEAV bleeding Plan colonoscopy

## 2017-11-27 NOTE — Anesthesia Procedure Notes (Signed)
Procedure Name: MAC Date/Time: 11/27/2017 1:08 PM Performed by: Cynda Familia, CRNA Pre-anesthesia Checklist: Patient identified, Emergency Drugs available, Suction available, Patient being monitored and Timeout performed Patient Re-evaluated:Patient Re-evaluated prior to induction Oxygen Delivery Method: Simple face mask Placement Confirmation: positive ETCO2 and breath sounds checked- equal and bilateral Dental Injury: Teeth and Oropharynx as per pre-operative assessment

## 2017-11-27 NOTE — Transfer of Care (Signed)
Immediate Anesthesia Transfer of Care Note  Patient: Dennis Booth  Procedure(s) Performed: COLONOSCOPY WITH PROPOFOL (N/A )  Patient Location: PACU and Endoscopy Unit  Anesthesia Type:MAC  Level of Consciousness: sedated  Airway & Oxygen Therapy: Patient Spontanous Breathing and Patient connected to face mask oxygen  Post-op Assessment: Report given to RN and Post -op Vital signs reviewed and stable  Post vital signs: Reviewed and stable  Last Vitals:  Vitals Value Taken Time  BP    Temp    Pulse    Resp    SpO2      Last Pain:  Vitals:   11/27/17 1148  TempSrc: (P) Oral  PainSc: (P) 0-No pain         Complications: No apparent anesthesia complications

## 2017-11-27 NOTE — Anesthesia Postprocedure Evaluation (Signed)
Anesthesia Post Note  Patient: Asani Mcburney  Procedure(s) Performed: COLONOSCOPY WITH PROPOFOL (N/A )     Patient location during evaluation: Endoscopy Anesthesia Type: MAC Level of consciousness: awake and alert Pain management: pain level controlled Vital Signs Assessment: post-procedure vital signs reviewed and stable Respiratory status: spontaneous breathing, nonlabored ventilation, respiratory function stable and patient connected to nasal cannula oxygen Cardiovascular status: blood pressure returned to baseline and stable Postop Assessment: no apparent nausea or vomiting Anesthetic complications: no    Last Vitals:  Vitals:   11/27/17 1350 11/27/17 1400  BP: 104/68 110/69  Pulse: (!) 47 (!) 49  Resp: 17 18  Temp:    SpO2: 100% 97%    Last Pain:  Vitals:   11/27/17 1400  TempSrc:   PainSc: 0-No pain                 Nathanel Tallman L Rachella Basden

## 2017-11-27 NOTE — Op Note (Signed)
[facility] Patient Name: Dennis Booth Procedure Date: 11/27/2017 MRN: [id number] Attending MD: Graylin Shiver , MD Date of Birth: 04/11/1965 CSN: [id number] Age: 52 Admit Type: Inpatient Procedure:                Colonoscopy Indications:              Rectal bleeding Providers:                Graylin Shiver, MD, Dwain Sarna, RN, Margo Aye, Technician, Heron Nay, CRNA Referring MD:              Medicines:                Propofol per Anesthesia Complications:            No immediate complications. Estimated Blood Loss:     Estimated blood loss: none. Procedure:                Pre-Anesthesia Assessment:                           - Prior to the procedure, a History and Physical                            was performed, and patient medications and                            allergies were reviewed. The patient's tolerance of                            previous anesthesia was also reviewed. The risks                            and benefits of the procedure and the sedation                            options and risks were discussed with the patient.                            All questions were answered, and informed consent                            was obtained. Prior Anticoagulants: The patient has                            taken no previous anticoagulant or antiplatelet                            agents. ASA Grade Assessment: II - A patient with                            mild systemic disease. After reviewing the risks  and benefits, the patient was deemed in                            satisfactory condition to undergo the procedure.                           After obtaining informed consent, the colonoscope                            was passed under direct vision. Throughout the                            procedure, the patient's blood pressure, pulse, and                            oxygen  saturations were monitored continuously. The                            [id number]-H190DL ([id number]) Olympus peds colonoscope was                            introduced through the anus and advanced to the the                            cecum, identified by appendiceal orifice and                            ileocecal valve. The ileocecal valve, appendiceal                            orifice, and rectum were photographed. The                            colonoscopy was performed without difficulty. The                            patient tolerated the procedure well. The quality                            of the bowel preparation was good. Scope In: 1:19:04 PM Scope Out: 1:28:40 PM Scope Withdrawal Time: 0 hours 5 minutes 55 seconds  Total Procedure Duration: 0 hours 9 minutes 36 seconds  Findings:      The perianal and digital rectal examinations were normal.      Multiple diverticula were found in the sigmoid colon and descending       colon.      Internal hemorrhoids were found during retroflexion. The hemorrhoids       were small.      The exam was otherwise without abnormality. Impression:               - Diverticulosis in the sigmoid colon and in the                            descending colon.                           -  Internal hemorrhoids.                           - The examination was otherwise normal.                           - No specimens collected. Moderate Sedation:      . Recommendation:           - Resume regular diet.                           - Continue present medications.                           - Repeat colonoscopy in 10 years for screening                            purposes. Procedure Code(s):        --- Professional ---                           [phone], Colonoscopy, flexible; diagnostic, including                            collection of specimen(s) by brushing or washing,                            when performed (separate procedure) Diagnosis Code(s):        ---  Professional ---                           K64.8, Other hemorrhoids                           K62.5, Hemorrhage of anus and rectum                           K57.30, Diverticulosis of large intestine without                            perforation or abscess without bleeding CPT copyright 2018 American [employer] Association. All rights reserved. The codes documented in this report are preliminary and upon coder review may  be revised to meet current compliance requirements. Graylin Shiver, MD 11/27/2017 1:34:48 PM This report has been signed electronically. Number of Addenda: 0

## 2017-11-27 NOTE — Discharge Instructions (Signed)

## 2017-11-27 NOTE — Anesthesia Preprocedure Evaluation (Addendum)
Anesthesia Evaluation  Patient identified by MRN, date of birth, ID band Patient awake    Reviewed: Allergy & Precautions, NPO status , Patient's Chart, lab work & pertinent test results  Airway Mallampati: II  TM Distance: >3 FB Neck ROM: Full    Dental no notable dental hx. (+) Teeth Intact, Dental Advisory Given   Pulmonary neg pulmonary ROS,    Pulmonary exam normal breath sounds clear to auscultation       Cardiovascular negative cardio ROS Normal cardiovascular exam Rhythm:Regular Rate:Normal     Neuro/Psych negative neurological ROS  negative psych ROS   GI/Hepatic negative GI ROS, Neg liver ROS,   Endo/Other  negative endocrine ROS  Renal/GU negative Renal ROS  negative genitourinary   Musculoskeletal negative musculoskeletal ROS (+)   Abdominal   Peds  Hematology negative hematology ROS (+)   Anesthesia Other Findings Rectal bleeding  Reproductive/Obstetrics                            Anesthesia Physical Anesthesia Plan  ASA: II  Anesthesia Plan: MAC   Post-op Pain Management:    Induction: Intravenous  PONV Risk Score and Plan: 1 and Treatment may vary due to age or medical condition and Propofol infusion  Airway Management Planned: Natural Airway and Simple Face Mask  Additional Equipment:   Intra-op Plan:   Post-operative Plan:   Informed Consent: I have reviewed the patients History and Physical, chart, labs and discussed the procedure including the risks, benefits and alternatives for the proposed anesthesia with the patient or authorized representative who has indicated his/her understanding and acceptance.   Dental advisory given  Plan Discussed with: CRNA  Anesthesia Plan Comments:         Anesthesia Quick Evaluation

## 2017-11-28 ENCOUNTER — Encounter (HOSPITAL_COMMUNITY): Payer: Self-pay | Admitting: Gastroenterology

## 2017-12-10 ENCOUNTER — Encounter: Payer: Self-pay | Admitting: Internal Medicine

## 2017-12-10 NOTE — Progress Notes (Signed)
   Subjective:    Patient ID: Dennis Booth, male    DOB: 05/08/1965, 52 y.o.   MRN: [id number]  HPI   Transcribed from written note by covering physician, Dr. Rodney Cruise.  Went away after another 1 wk; now just came back 1 wk ago.  52 yo HM c/o 1 wk of small amt red blood with BM's.  Same thing about 1 yr ago--went away after 1 wk.  No abd pain, shortness of breath, rash, fever, diarrhea, constipation, rectal pain, bruising, bleeding elsewhere.  Does not know what happened--had been referred last yr for colonoscopy, but never got scheduled.  CBC was normal.   (no sx in between episodes)  No outpatient medications have been marked as taking for the 09/06/17 encounter (Office Visit) with Julieanne Manson, MD.   Allergies  Allergen Reactions  . Fruit & Vegetable Daily [Nutritional Supplements] Other (See Comments)    States when he eats any type of fruit and takes Tylenol at same time, gets irritation in his throat--drinks milk and gets better, but was prescribed Epipen for this in past--not clear why.    Review of Systems     Objective:   Physical Exam Skin:  Nl HEENT:  Nl Neck:  No adenopathy Chest:  Clear CV:  RRR - M Abdomen/Pelvis:  NT  No masses Extremities:  No LLE       Assessment & Plan:  1.  Rectal bleeding--due for initial screening colonoscopy-please refer/schedule colonoscopy Check CBC- today--continue metamucil daly Increase H2O qd  2.  Prediabetes- check HgbA1C today  3.  F/u--3 mo's for well visit.

## 2017-12-13 ENCOUNTER — Encounter: Payer: Self-pay | Admitting: Internal Medicine

## 2017-12-13 ENCOUNTER — Ambulatory Visit: Payer: Self-pay | Admitting: Internal Medicine

## 2017-12-13 VITALS — BP 122/82 | HR 86 | Resp 12 | Ht 63.0 in | Wt 176.0 lb

## 2017-12-13 DIAGNOSIS — J3089 Other allergic rhinitis: Secondary | ICD-10-CM

## 2017-12-13 DIAGNOSIS — E669 Obesity, unspecified: Secondary | ICD-10-CM

## 2017-12-13 DIAGNOSIS — K5790 Diverticulosis of intestine, part unspecified, without perforation or abscess without bleeding: Secondary | ICD-10-CM

## 2017-12-13 DIAGNOSIS — K648 Other hemorrhoids: Secondary | ICD-10-CM

## 2017-12-13 DIAGNOSIS — Z6831 Body mass index (BMI) 31.0-31.9, adult: Secondary | ICD-10-CM

## 2017-12-13 DIAGNOSIS — Z0000 Encounter for general adult medical examination without abnormal findings: Secondary | ICD-10-CM

## 2017-12-13 MED ORDER — PSYLLIUM 48.57 % PO POWD: ORAL | 0 refills | Status: DC

## 2017-12-13 MED ORDER — CETIRIZINE HCL 10 MG PO TABS: 10.0000 mg | ORAL_TABLET | Freq: Every day | ORAL | 11 refills | Status: DC

## 2017-12-13 NOTE — Patient Instructions (Addendum)

## 2017-12-13 NOTE — Progress Notes (Signed)
Subjective:    Patient ID: Dennis Booth, male    DOB: 09/04/1965, 52 y.o.   MRN: [id number]  HPI   Here for Male CPE:  1.  STE:  Does check with washing, but not purposefully.  No family history of testicular cancer.  2.  PSA/DRE:  Never.  DRE recently by Dr. Elwin Sleight.    3.  Guaiac Cards:  Just had colonoscopy.  4.  Colonoscopy:  Underwent diagnostic colonoscopy 11/27/2017 with Dr. Evette Cristal, GI.  Found to have diverticulosis and internal hemorrhoids, assuming the latter the cause of BRBPR.  No family history of colon cancer.  5.  Cholesterol/Glucose:  Cholesterol in 2017 with elevated triglycerides and patient with A1C of 5.7% or prediabetes also 2017.   Lipid Panel     Component Value Date/Time   CHOL 161 09/07/2015 0950   TRIG 161 (H) 09/07/2015 0950   HDL 50 09/07/2015 0950   LDLCALC 79 09/07/2015 0950    6.  Immunizations:  Immunization History  Administered Date(s) Administered  . Tdap 09/15/2015   No influenza yet this year.      Review of Systems  Constitutional: Negative for appetite change, fatigue and fever.  HENT: Positive for dental problem (On Clindamycin now for abscessed tooth.  to have root canal in near future throught dental clinic.) and sore throat (Mucous and itching in throat). Negative for rhinorrhea and sneezing.   Eyes: Negative for discharge and itching.  Respiratory: Positive for cough. Negative for shortness of breath.   Cardiovascular: Negative for chest pain, palpitations and leg swelling.  Gastrointestinal: Negative for abdominal pain, blood in stool (did have earlier in the year.  Found to have internal hemorrhoids.  ), constipation and diarrhea.  Genitourinary: Negative for discharge, dysuria and testicular pain.  Musculoskeletal: Negative for arthralgias.  Skin: Negative for rash.  Neurological: Negative for weakness, numbness and headaches.  Psychiatric/Behavioral: Negative for dysphoric mood. The patient is not  nervous/anxious.        Objective:   Physical Exam  Constitutional: He is oriented to person, place, and time. He appears well-developed and well-nourished.  HENT:  Head: Normocephalic and atraumatic.  Right Ear: Hearing, tympanic membrane, external ear and ear canal normal.  Left Ear: Hearing, tympanic membrane, external ear and ear canal normal.  Mouth/Throat: Uvula is midline, oropharynx is clear and moist and mucous membranes are normal.  Nasal mucosa swollen and erythematous.  Clear discharge.  Eyes: Pupils are equal, round, and reactive to light. Conjunctivae and EOM are normal.  Discs Sharp bilaterally  Neck: Normal range of motion and full passive range of motion without pain. Neck supple. No thyromegaly present.  Cardiovascular: Normal rate, regular rhythm, S1 normal and S2 normal. Exam reveals no S3, no S4 and no friction rub.  No murmur heard. No carotid bruits.  Carotid, radial, femoral, DP and PT pulses normal and equal.   Pulmonary/Chest: Effort normal and breath sounds normal.  Abdominal: Soft. Bowel sounds are normal. He exhibits no mass. There is no hepatosplenomegaly. There is no tenderness. No hernia.  Genitourinary: Penis normal. Right testis shows no mass, no swelling and no tenderness. Right testis is descended. Left testis shows no mass, no swelling and no tenderness. Left testis is descended. Circumcised.  Genitourinary Comments: Left testicle with minimal thickening of vessel to testicle.  NT  Musculoskeletal: Normal range of motion.  Lymphadenopathy:       Head (right side): No submental and no submandibular adenopathy present.  Head (left side): No submental and no submandibular adenopathy present.    He has no cervical adenopathy.    He has no axillary adenopathy.       Right: No inguinal and no supraclavicular adenopathy present.       Left: No inguinal and no supraclavicular adenopathy present.  Neurological: He is alert and oriented to person, place,  and time. He has normal strength and normal reflexes. No cranial nerve deficit or sensory deficit. He exhibits normal muscle tone. Coordination and gait normal.  Skin: Skin is warm. Capillary refill takes less than 2 seconds.  Psychiatric: He has a normal mood and affect. His speech is normal and behavior is normal. Judgment and thought content normal. Cognition and memory are normal.          Assessment & Plan:  1.   CPE No need for guaiac cards with recent diagnostic colonoscopy Return for fasting labs:  CBC, CMP, PSA, A1C, FLP  2.  Dyslipidemia:  Return for FLP  3.  Prediabetes:  Return for A1C  4.  Internal hemorrhoids and diverticulosis:  Increase fiber in diet with fruits, veggies, and starches with whole grains.   Metamucil in 8 oz water daily as well.  5. Allergies:  Cetirizine 10 mg daily.

## 2017-12-28 ENCOUNTER — Telehealth: Payer: Self-pay | Admitting: Internal Medicine

## 2017-12-28 NOTE — Telephone Encounter (Signed)
LVM for patient to give more information  Pt. Called back Is it just one tooth that is giving problems? Yes one tooth  Has pt. Contacted the dentist office? No, he has not. Pt. will contact dentist

## 2017-12-28 NOTE — Telephone Encounter (Signed)
Patient called requesting if can be prescribed with a different antibiotic than clindamycin (CLEOCIN) 300 MG capsule, prescribed by dental clinic.  Patient stated finished with 14 days treatment four days ago and  inflammation stills.  Please advise.

## 2018-01-01 ENCOUNTER — Other Ambulatory Visit: Payer: Self-pay

## 2018-01-01 DIAGNOSIS — Z13220 Encounter for screening for lipoid disorders: Secondary | ICD-10-CM

## 2018-01-01 DIAGNOSIS — R7303 Prediabetes: Secondary | ICD-10-CM

## 2018-01-01 DIAGNOSIS — Z79899 Other long term (current) drug therapy: Secondary | ICD-10-CM

## 2018-01-01 DIAGNOSIS — Z125 Encounter for screening for malignant neoplasm of prostate: Secondary | ICD-10-CM

## 2018-01-02 LAB — CBC WITH DIFFERENTIAL/PLATELET
BASOS ABS: 0 10*3/uL (ref 0.0–0.2)
Basos: 1 %
EOS (ABSOLUTE): 0.1 10*3/uL (ref 0.0–0.4)
Eos: 2 %
HEMOGLOBIN: 14.2 g/dL (ref 13.0–17.7)
Hematocrit: 42 % (ref 37.5–51.0)
IMMATURE GRANULOCYTES: 0 %
Immature Grans (Abs): 0 10*3/uL (ref 0.0–0.1)
LYMPHS ABS: 1.5 10*3/uL (ref 0.7–3.1)
Lymphs: 27 %
MCH: 30.5 pg (ref 26.6–33.0)
MCHC: 33.8 g/dL (ref 31.5–35.7)
MCV: 90 fL (ref 79–97)
MONOCYTES: 8 %
Monocytes Absolute: 0.4 10*3/uL (ref 0.1–0.9)
NEUTROS PCT: 62 %
Neutrophils Absolute: 3.4 10*3/uL (ref 1.4–7.0)
Platelets: 212 10*3/uL (ref 150–450)
RBC: 4.65 x10E6/uL (ref 4.14–5.80)
RDW: 12.8 % (ref 12.3–15.4)
WBC: 5.4 10*3/uL (ref 3.4–10.8)

## 2018-01-02 LAB — LIPID PANEL W/O CHOL/HDL RATIO
Cholesterol, Total: 170 mg/dL (ref 100–199)
HDL: 50 mg/dL (ref >39)
LDL CALC: 94 mg/dL (ref 0–99)
Triglycerides: 131 mg/dL (ref 0–149)
VLDL Cholesterol Cal: 26 mg/dL (ref 5–40)

## 2018-01-02 LAB — COMPREHENSIVE METABOLIC PANEL
ALBUMIN: 4.1 g/dL (ref 3.5–5.5)
ALK PHOS: 76 IU/L (ref 39–117)
ALT: 25 IU/L (ref 0–44)
AST: 17 IU/L (ref 0–40)
Albumin/Globulin Ratio: 1.4 (ref 1.2–2.2)
BUN / CREAT RATIO: 17 (ref 9–20)
BUN: 13 mg/dL (ref 6–24)
Bilirubin Total: 0.4 mg/dL (ref 0.0–1.2)
CALCIUM: 9.2 mg/dL (ref 8.7–10.2)
CO2: 23 mmol/L (ref 20–29)
CREATININE: 0.78 mg/dL (ref 0.76–1.27)
Chloride: 104 mmol/L (ref 96–106)
GFR calc Af Amer: 121 mL/min/{1.73_m2} (ref >59)
GFR calc non Af Amer: 105 mL/min/{1.73_m2} (ref >59)
GLUCOSE: 102 mg/dL — AB (ref 65–99)
Globulin, Total: 2.9 g/dL (ref 1.5–4.5)
Potassium: 4.5 mmol/L (ref 3.5–5.2)
Sodium: 142 mmol/L (ref 134–144)
Total Protein: 7 g/dL (ref 6.0–8.5)

## 2018-01-02 LAB — PSA: PROSTATE SPECIFIC AG, SERUM: 1.5 ng/mL (ref 0.0–4.0)

## 2018-01-02 LAB — HGB A1C W/O EAG: Hgb A1c MFr Bld: 6 % — ABNORMAL HIGH (ref 4.8–5.6)

## 2018-03-09 ENCOUNTER — Encounter: Payer: Self-pay | Admitting: Internal Medicine

## 2018-04-11 ENCOUNTER — Encounter: Payer: Self-pay | Admitting: Internal Medicine

## 2018-04-11 ENCOUNTER — Ambulatory Visit: Payer: Self-pay | Admitting: Internal Medicine

## 2018-04-11 VITALS — BP 126/78 | HR 78 | Resp 12 | Ht 63.0 in | Wt 184.0 lb

## 2018-04-11 DIAGNOSIS — E669 Obesity, unspecified: Secondary | ICD-10-CM

## 2018-04-11 DIAGNOSIS — Z6830 Body mass index (BMI) 30.0-30.9, adult: Secondary | ICD-10-CM

## 2018-04-11 DIAGNOSIS — R7303 Prediabetes: Secondary | ICD-10-CM

## 2018-04-11 NOTE — Progress Notes (Signed)
    Subjective:    Patient ID: Dennis Booth, male   DOB: 07/28/1965, 53 y.o.   MRN: [id number]   HPI   Here for follow up of obesity and prediabetes. Has basically not started any lifestyle changes since last seen.   Has not made any dietary changes.  He does some of cooking, but his wife does most.  She has an appt with me in a month.  Dennis Booth.  She could use some changes with what she cooks They have a 30 year old daughter who is a bit overweight.  He is willing to make more specific goals for physical activity and dietary changes.     Current Meds  Medication Sig  . cetirizine (ZYRTEC) 10 MG tablet Take 1 tablet (10 mg total) by mouth daily.   Allergies  Allergen Reactions  . Fruit & Vegetable Daily [Nutritional Supplements] Other (See Comments)    States when he eats any type of fruit and takes Tylenol at same time, gets irritation in his throat--drinks milk and gets better, but was prescribed Epipen for this in past--not clear why.     Review of Systems    Objective:   BP 126/78 (BP Location: Left Arm, Patient Position: Sitting, Cuff Size: Normal)   Pulse 78   Resp 12   Ht 5\' 3"  (1.6 m)   Wt 184 lb (83.5 kg)   BMI 32.59 kg/m   Physical Exam   Lungs:  CTA CV:  RRR without murmur or rub.  Radial pulses normal and equal LE:  No edema   Assessment & Plan   Prediabetes and obesity:   He will start tomorrow with 20 minutes of walking, with or without his daughter and wife before he goes to work in the afternoon. To add 5 minutes every Friday until up to at least 60 minutes daily. He will make a goal by Monday after speaking with his wife to eat 5 servings of vegetables daily and to add a new goal each week thereafter from list of recommendations in his instruction sheet. Discussed he may weigh himself the same day of each week with goal of 1/2 to 1 lb weight loss weekly.

## 2018-04-11 NOTE — Patient Instructions (Signed)

## 2018-06-15 ENCOUNTER — Other Ambulatory Visit: Payer: Self-pay

## 2018-06-15 ENCOUNTER — Encounter (HOSPITAL_COMMUNITY): Payer: Self-pay | Admitting: Emergency Medicine

## 2018-06-15 ENCOUNTER — Emergency Department (HOSPITAL_COMMUNITY): Payer: Self-pay

## 2018-06-15 ENCOUNTER — Emergency Department (HOSPITAL_COMMUNITY)
Admission: EM | Admit: 2018-06-15 | Discharge: 2018-06-16 | Disposition: A | Discharge status: Home / Self Care | Payer: Self-pay | Attending: Emergency Medicine | Admitting: Emergency Medicine

## 2018-06-15 DIAGNOSIS — W312XXA Contact with powered woodworking and forming machines, initial encounter: Secondary | ICD-10-CM | POA: Insufficient documentation

## 2018-06-15 DIAGNOSIS — Y9389 Activity, other specified: Secondary | ICD-10-CM | POA: Insufficient documentation

## 2018-06-15 DIAGNOSIS — Y929 Unspecified place or not applicable: Secondary | ICD-10-CM | POA: Insufficient documentation

## 2018-06-15 DIAGNOSIS — S61217A Laceration without foreign body of left little finger without damage to nail, initial encounter: Secondary | ICD-10-CM | POA: Insufficient documentation

## 2018-06-15 DIAGNOSIS — Y999 Unspecified external cause status: Secondary | ICD-10-CM | POA: Insufficient documentation

## 2018-06-15 MED ORDER — LIDOCAINE HCL 2 % IJ SOLN
10.0000 mL | Freq: Once | INTRAMUSCULAR | Status: AC
  Administered 2018-06-16: 200 mg
  Filled 2018-06-15: qty 20

## 2018-06-15 NOTE — ED Provider Notes (Signed)
Laceration repair by me.   LACERATION REPAIR Performed by: Fayrene Helper Authorized by: Fayrene Helper Consent: Verbal consent obtained. Risks and benefits: risks, benefits and alternatives were discussed Consent given by: patient Patient identity confirmed: provided demographic data Prepped and Draped in normal sterile fashion Wound explored  Laceration Location: L little finger  Laceration Length: 5cm  No Foreign Bodies seen or palpated  Anesthesia: local infiltration  Local anesthetic: lidocaine 2% w/o epinephrine  Anesthetic total: 5 ml  Irrigation method: syringe Amount of cleaning: standard  Skin closure: prolene 4.0-5.0  Number of sutures: 9  Technique: simple interrupted  Patient tolerance: Patient tolerated the procedure well with no immediate complications.    Fayrene Helper, PA-C 06/15/18 2348    Ward, Layla Maw, DO 06/16/18 0003

## 2018-06-15 NOTE — ED Triage Notes (Signed)
Pt reports using a table saw and cutting left pinky finger bleeding controlled with dressing curved laceration from medial posterior portion of finger medially to anterior portion of finger.

## 2018-06-15 NOTE — ED Provider Notes (Signed)
[facility]-EMERGENCY DEPT Provider Note   CSN: [id number] Arrival date & time: 06/15/18  2103    History   Chief Complaint Chief Complaint  Patient presents with  . Extremity Laceration    left hand    HPI Dennis Booth is a 53 y.o. male with a history of prediabetes, diverticulosis, internal hemorrhoids, and environmental and seasonal allergies who presents to the emergency department with a chief complaint of left pinky finger injury.  The patient reports that he was using a table saw at approximately 2030 when he cut his left pinky.  He denies numbness, weakness, injury to the first through fourth digits, left wrist pain, fever, chills.  No treatment prior to arrival.  He rates the pain to the digit at 6 out of 10.  No known aggravating or alleviating factors.  He is right-hand dominant.  Per chart review, Tdap was administered on 09/15/15.  He has no allergies to antibiotics.     The history is provided by the patient. No language interpreter was used.    Past Medical History:  Diagnosis Date  . Atopic dermatitis    Previously followed by Dr. Amy SwazilandJordan.  Tacrolimu 1% bid and Fluocinonide 0.05% bid, Claritin for itch  . Diverticulosis 11/27/2017   Dr. Evette CristalGanem, GI on diagnostic colonoscopy for rectal bleeding.  . Environmental and seasonal allergies   . Internal hemorrhoids 11/27/2017   Dr. Evette CristalGanem on diagnostic colonoscopy for rectal bleeding.  . Prediabetes 2013    Patient Active Problem List   Diagnosis Date Noted  . Environmental and seasonal allergies   . Diverticulosis 11/27/2017  . Internal hemorrhoids 11/27/2017  . Prediabetes 06/07/2015    Past Surgical History:  Procedure Laterality Date  . COLONOSCOPY WITH PROPOFOL N/A 11/27/2017   Procedure: COLONOSCOPY WITH PROPOFOL;  Surgeon: Graylin ShiverGanem, Salem F, MD;  Location: [facility];  Service: Endoscopy;  Laterality: N/A;        Home Medications    Prior to Admission medications    Medication Sig Start Date End Date Taking? Authorizing Provider  sulfamethoxazole-trimethoprim (BACTRIM DS) 800-160 MG tablet Take 1 tablet by mouth 2 (two) times daily for 7 days. 06/16/18 06/23/18  McDonald, Coral ElseMia A, PA-C    Family History Family History  Problem Relation Age of Onset  . Diabetes Mother   . Diabetes Father     Social History Social History   Tobacco Use  . Smoking status: Never Smoker  . Smokeless tobacco: Never Used  Substance Use Topics  . Alcohol use: Yes    Alcohol/week: 0.0 standard drinks    Comment: Beer occasionally  . Drug use: No     Allergies   Fruit & vegetable daily [nutritional supplements]   Review of Systems Review of Systems  Constitutional: Negative for activity change.  Respiratory: Negative for shortness of breath.   Cardiovascular: Negative for chest pain.  Gastrointestinal: Negative for abdominal pain.  Musculoskeletal: Negative for back pain.  Skin: Positive for wound. Negative for rash.  Neurological: Negative for weakness and numbness.     Physical Exam Updated Vital Signs BP 127/87   Pulse 72   Temp 98 F (36.7 C)   Resp 18   Ht 5\' 1"  (1.549 m)   Wt 81.6 kg   SpO2 94%   BMI 34.01 kg/m   Physical Exam Vitals signs and nursing note reviewed.  Constitutional:      Appearance: He is well-developed.  HENT:     Head: Normocephalic.  Eyes:  Conjunctiva/sclera: Conjunctivae normal.  Neck:     Musculoskeletal: Neck supple.  Cardiovascular:     Rate and Rhythm: Normal rate and regular rhythm.     Heart sounds: No murmur.  Pulmonary:     Effort: Pulmonary effort is normal.  Abdominal:     General: There is no distension.     Palpations: Abdomen is soft.  Musculoskeletal:     Comments: There is a 4 cm laceration noted to the left pinky that began he is on the palmar surface and extends circumferentially across the lateral surface to the dorsal surface to more than 180 degrees of the digit.  Wound is several  millimeters deep.  Hemostatic.  No obvious foreign bodies.  5 out of 5 strength against resistance with flexion, extension, abduction, and adduction.  Sensation is intact to all 4 distal tips of the left pinky.  Radial and ulnar pulses are intact.  Capillary refill is less than 2 seconds.  Skin:    General: Skin is warm and dry.  Neurological:     Mental Status: He is alert.  Psychiatric:        Behavior: Behavior normal.            ED Treatments / Results  Labs (all labs ordered are listed, but only abnormal results are displayed) Labs Reviewed - No data to display  EKG None  Radiology Dg Finger Little Left  Result Date: 06/15/2018 CLINICAL DATA:  53 y/o  M; laceration. EXAM: LEFT LITTLE FINGER 2+V COMPARISON:  None. FINDINGS: There is a tiny cortical indentation within the fifth proximal phalanx deep to the laceration which may represent a small bone laceration. No fracture fracture line is visible. Joint spaces are maintained. IMPRESSION: Tiny cortical indentation of the fifth proximal phalanx deep to the soft tissue laceration which may represent a small bone laceration. No fracture line is visible. No joint dislocation. Electronically Signed   By: Mitzi Hansen M.D.   On: 06/15/2018 22:50    Procedures Procedures (including critical care time)  Medications Ordered in ED Medications  lidocaine (XYLOCAINE) 2 % (with pres) injection 200 mg (200 mg Infiltration Given 06/16/18 0030)  bacitracin 500 UNIT/GM ointment (1 application  Given 06/16/18 0030)     Initial Impression / Assessment and Plan / ED Course  I have reviewed the triage vital signs and the nursing notes.  Pertinent labs & imaging results that were available during my care of the patient were reviewed by me and considered in my medical decision making (see chart for details).        53 year old male with a history of prediabetes, diverticulosis, internal hemorrhoids, and environmental and  seasonal allergies presenting with an injury to the left fifth digit that occurred approximately 2 hours prior to arrival.  He injured the finger while using a table saw.  Tdap is up-to-date.  He is neurovascularly intact on exam.  He declines pain medication at this time.  The patient was discussed with Dr. Rush Landmark, attending physician.  X-ray of the digit with tiny cortical indention of the fifth proximal phalanx deep to the soft tissue laceration which may represent a small bone laceration.  No fracture line is visible.  There is no joint dislocation.    On exam, the patient is neurovascularly intact to the left fifth digit.  Low suspicion for tendon or vascular involvement.  Laceration repair was performed by PA Fayrene Helper.  Please see his procedure note for more details.  Bacitracin was applied  to the wound and a non-adherent dressing was applied prior to a static finger splint.  He was given home wound care instructions and advised to follow-up with hand surgery.  Since the wound was deeper and may possibly involve a bone laceration, he was started on Bactrim twice daily.  He declines pain control in the ER at this time.  Will discharge to home with outpatient follow-up to hand surgery.  He remains hemodynamically stable and in no acute distress.  Return precautions to the ER were given.  Discharge instructions were given to the patient using a Spanish interpreter.  All questions were answered and he is in agreement with the plan at this time.  Safe for discharge home with outpatient follow-up.    Final Clinical Impressions(s) / ED Diagnoses   Final diagnoses:  Laceration of left little finger without foreign body without damage to nail, initial encounter    ED Discharge Orders         Ordered    doxycycline (VIBRAMYCIN) 100 MG capsule  2 times daily,   Status:  Discontinued     06/16/18 0016    sulfamethoxazole-trimethoprim (BACTRIM DS) 800-160 MG tablet  2 times daily     06/16/18 0018            McDonald, Pedro Earls A, PA-C 06/16/18 1610    Tegeler, Canary Brim, MD 06/16/18 [phone]

## 2018-06-15 NOTE — ED Notes (Signed)
Patient transported to X-ray 

## 2018-06-16 MED ORDER — SULFAMETHOXAZOLE-TRIMETHOPRIM 800-160 MG PO TABS: 1.0000 | ORAL_TABLET | Freq: Two times a day (BID) | ORAL | 0 refills | Status: AC

## 2018-06-16 MED ORDER — BACITRACIN ZINC 500 UNIT/GM EX OINT
TOPICAL_OINTMENT | CUTANEOUS | Status: AC
  Administered 2018-06-16: 1
  Filled 2018-06-16: qty 0.9

## 2018-06-16 MED ORDER — DOXYCYCLINE HYCLATE 100 MG PO CAPS: 100.0000 mg | ORAL_CAPSULE | Freq: Two times a day (BID) | ORAL | 0 refills | Status: DC

## 2018-06-16 NOTE — Discharge Instructions (Signed)
Gracias por permitirme cuidardelo hoy en el Departamento de [address].   Por favor llame a la oficina del Dr. Merlyn Lot el lunes por la maana para programar una cita de seguimiento para algn momento en la prxima semana. [place] es cirujano de [place].   Para tratar el dolor en casa, puede tomar 600 mg de ibuprofeno con alimentos o 650 mg de Tylenol una vez cada 6 horas para controlar el dolor o alternar entre estos 2 medicamentos cada 3 horas.  Tambin puedes intentar aplicar una bolsa de hielo durante 15 a 20 minutos con la frecuencia necesaria para ayudar con el dolor y la hinchazn.  Asegrese de [employer] dedo recto si se quita la frula para aplicar una compresa de hielo para que no rellene los puntos de sutura.  Los puntos de sutura deben retirarse en 7 a [address]  Kuzma, puede retirarlos en la oficina.  Para cuidar la herida en casa, limpie el rea con agua tibia y jabn al menos una vez al da.  Seque la herida y luego aplique un antibitico tpico, como bacitracina o neosporina.  Despus de aplicar el antibitico tpico, aplique un vendaje antiadherente para ayudar a proteger el rea y mantenerla limpia.  Despus de haber aplicado el apsito de gasa, puede aplicar la frula del dedo.  Dado que la herida era profunda, es necesario tomar un antibitico.  Tomar 1 comprimido de Bactrim por va oral 2 veces al da durante la semana siguiente.  Es importante usar la frula de los dedos con la mayor frecuencia posible hasta que el Dr. Merlyn Lot diga que puede quitarla para evitar arrancarse las suturas.  Debe regresar al servicio de urgencias si presenta signos o sntomas de infeccin.  Esto incluira fiebre, escalofros, si el dedo se hincha mucho, se calienta al tacto, rojo, si usted desarrolla rojo rayando por el dedo o la mano, o si usted comienza a tener drenaje grueso, similar a la mucosidad de la herida.  Thank you for allowing me to care for you today in the Emergency Department.   Please call  Dr. Merrilee Seashore office on Monday morning to schedule a follow-up appointment for sometime in the next week. Dr. Merlyn Lot is a Hydrographic surveyor.   To treat your pain at home, you can take 600 mg of ibuprofen with food or 650 mg of Tylenol once every 6 hours for pain control or alternate between these 2 medications every 3 hours.  You can also try applying a an ice pack for 15 to 20 minutes as frequently as needed to help with pain and swelling.  Make sure that you are keeping your finger straight if you do take off the splint to apply an ice pack so that you do not refill your stitches.  Your stitches need to be removed in 7 to 10 days.  Since you are following up with Dr. Merlyn Lot, he can remove them in the office.  To care for your wound at home, clean the area with warm water and soap at least once daily.  Pat the wound dry then apply a topical antibiotic, such as bacitracin or Neosporin.  After applying the topical antibiotic, apply a nonstick gauze dressing to help protect the area and keep it clean.  After you have applied the gauze dressing you can then apply the finger splint.  Since the wound was deep, you need to take an antibiotic.  Take 1 tablet of Bactrim by mouth 2 times per day for the next week.  It is important to wear the finger splint as frequently as possible until Dr. Merlyn LotKuzma says that you may remove it to avoid ripping out your sutures.  You should return to the emergency department if you develop signs or symptoms of infection.  This would include fever, chills, if the finger gets extremely swollen, hot to the touch, red, if you develop red streaking down the finger or hand, or if you start to have thick, mucus-like drainage from the wound.

## 2018-07-11 ENCOUNTER — Ambulatory Visit: Payer: Self-pay | Admitting: Internal Medicine

## 2018-07-15 ENCOUNTER — Encounter: Payer: Self-pay | Admitting: Internal Medicine

## 2018-07-15 ENCOUNTER — Ambulatory Visit: Payer: Self-pay | Admitting: Internal Medicine

## 2018-07-15 ENCOUNTER — Other Ambulatory Visit: Payer: Self-pay

## 2018-07-15 VITALS — BP 130/80 | HR 72 | Resp 12 | Ht 63.0 in | Wt 174.0 lb

## 2018-07-15 DIAGNOSIS — R7303 Prediabetes: Secondary | ICD-10-CM

## 2018-07-15 DIAGNOSIS — Z4802 Encounter for removal of sutures: Secondary | ICD-10-CM

## 2018-07-15 NOTE — Progress Notes (Signed)
    Subjective:    Patient ID: Dennis Booth, male   DOB: December 08, 1965, 53 y.o.   MRN: [id number]   HPI   1.  Laceration to left pinky about 1 month ago.  Was cutting wood with an electric saw and got his finger instead.  Did not know he should have had stitches out a long time ago.  No pain, redness or swelling.  Lots of crusting.    2.  Prediabetes:  Feels he is more active and is eating in a good way.  He is eating only 2 servings of vegetables, however, per day.   He ate pan dulce today and states he is doing that twice weekly.  Discussed would recommend less bread and whole grain bread when he does eat it.  Have discussed at length diet in past. Walks 30 minutes daily. 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  Weight down 10 lbs from last visit.  No outpatient medications have been marked as taking for the 07/15/18 encounter (Office Visit) with Mack Hook, MD.   Allergies  Allergen Reactions  . Fruit & Vegetable Daily [Nutritional Supplements] Other (See Comments)    States when he eats any type of fruit and takes Tylenol at same time, gets irritation in his throat--drinks milk and gets better, but was prescribed Epipen for this in past--not clear why.     Review of Systems    Objective:   BP 130/80 (BP Location: Left Arm, Patient Position: Sitting, Cuff Size: Normal)   Pulse 72   Resp 12   Ht 5\' 3"  (1.6 m)   Wt 174 lb (78.9 kg)   BMI 30.82 kg/m   Physical Exam   NAD Lungs:  CTA CV:  RRR without murmur or rub.  Radial pulses normal and equal Left pinky with spiral laceration involving lateral aspect of finger.  9 simple interrupted sutures removed.  Edges of wound well opposed and healing nicely.  Significant crusting of medial wound.   Antibacterial ointment applied with dry bandage.    Assessment & Plan   1.  Prediabetes:  A1C today.  Encouraged more veggies in daily diet.  Will see what A1C is today.  2.  History of left pinky  laceration.  Appears to be healing well.  Sutures removed.   Keep covered with antibiotic ointment over crusted area until falls away and edges of wound well healed together.

## 2018-07-16 LAB — HGB A1C W/O EAG: Hgb A1c MFr Bld: 5.9 % — ABNORMAL HIGH (ref 4.8–5.6)

## 2019-01-16 ENCOUNTER — Ambulatory Visit (INDEPENDENT_AMBULATORY_CARE_PROVIDER_SITE_OTHER): Payer: Self-pay | Admitting: Internal Medicine

## 2019-01-16 ENCOUNTER — Other Ambulatory Visit: Payer: Self-pay

## 2019-01-16 ENCOUNTER — Encounter: Payer: Self-pay | Admitting: Internal Medicine

## 2019-01-16 VITALS — BP 130/70 | HR 72 | Resp 12 | Ht 63.0 in | Wt 175.0 lb

## 2019-01-16 DIAGNOSIS — I861 Scrotal varices: Secondary | ICD-10-CM

## 2019-01-16 DIAGNOSIS — R7303 Prediabetes: Secondary | ICD-10-CM

## 2019-01-16 DIAGNOSIS — Z114 Encounter for screening for human immunodeficiency virus [HIV]: Secondary | ICD-10-CM

## 2019-01-16 DIAGNOSIS — E010 Iodine-deficiency related diffuse (endemic) goiter: Secondary | ICD-10-CM

## 2019-01-16 DIAGNOSIS — Z0000 Encounter for general adult medical examination without abnormal findings: Secondary | ICD-10-CM

## 2019-01-16 NOTE — Progress Notes (Signed)
Subjective:    Patient ID: Dennis Booth, male   DOB: [birth year], 53 y.o.   MRN: [id number]   HPI   Here for Male CPE:  1.  STE:  Does not perform.  No family history of testicular cancer.  2.  PSA:  PSA normal 12/2017 at 1.5 ng/ml.  No family history of prostate cancer.  3.  Guaiac Cards:  Last performed 09/2015 and negative.  He recalls how to perform  4.  Colonoscopy: Performed 11/27/2017 for rectal bleeding with Dr. Evette Cristal.    Diverticulosis in the sigmoid colon and in the descending colon. - Internal hemorrhoids. - The examination was otherwise normal. - No specimens collected  5.  Cholesterol/Glucose:  Last FLP was acceptable 01/01/2018 Lipid Panel     Component Value Date/Time   CHOL 170 01/01/2018 0901   TRIG 131 01/01/2018 0901   HDL 50 01/01/2018 0901   LDLCALC 94 01/01/2018 0901   LABVLDL 26 01/01/2018 0901   History of prediabetes with last A1C improved at 5.9%, down from 6.0% 1 year ago  He feels he has been good with diet and physical activity to keep these under control.   6.  Immunizations: Has not obtained influenza this season.  We are out currently. Immunization History  Administered Date(s) Administered  . Influenza,inj,Quad PF,6+ Mos 12/14/2017  . Tdap 09/15/2015     No outpatient medications have been marked as taking for the 01/16/19 encounter (Office Visit) with Julieanne Manson, MD.   Allergies  Allergen Reactions  . Fruit & Vegetable Daily [Nutritional Supplements] Other (See Comments)    States when he eats any type of fruit and takes Tylenol at same time, gets irritation in his throat--drinks milk and gets better, but was prescribed Epipen for this in past--not clear why.   Past Medical History:  Diagnosis Date  . Atopic dermatitis    Previously followed by Dr. Amy Sw[place].  Tacrolimu 1% bid and Fluocinonide 0.05% bid, Claritin for itch  . Diverticulosis 11/27/2017   Dr. Evette Cristal, GI on diagnostic colonoscopy for rectal  bleeding.  . Environmental and seasonal allergies   . Internal hemorrhoids 11/27/2017   Dr. Evette Cristal on diagnostic colonoscopy for rectal bleeding.  . Prediabetes 2013    Past Surgical History:  Procedure Laterality Date  . COLONOSCOPY WITH PROPOFOL N/A 11/27/2017   Procedure: COLONOSCOPY WITH PROPOFOL;  Surgeon: Graylin Shiver, MD;  Location: [facility];  Service: Endoscopy;  Laterality: N/A;    Family History  Problem Relation Age of Onset  . Diabetes Mother   . Diabetes Father    Social History   Socioeconomic History  . Marital status: Married    Spouse name: Philippa Sicks  . Number of children: 4  . Years of education: 7  . Highest education level: Not on file  Occupational History  . Occupation: Soil scientist for businesses  Tobacco Use  . Smoking status: Never Smoker  . Smokeless tobacco: Never Used  Substance and Sexual Activity  . Alcohol use: Yes    Alcohol/week: 0.0 standard drinks    Comment: Beer occasionally  . Drug use: No  . Sexual activity: Yes    Birth control/protection: Coitus interruptus  Other Topics Concern  . Not on file  Social History Narrative      [place]   Lives with his wife, Philippa Sicks and her daughter.  Vilma is also a patient at [employer].   Children still at home live  with their mother.   His children are at his home every week.   Social Determinants of Health   Financial Resource Strain:   . Difficulty of Paying Living Expenses: Not on file  Food Insecurity: No Food Insecurity  . Worried About Programme researcher, broadcasting/film/videounning Out of Food in the Last Year: Never true  . Ran Out of Food in the Last Year: Never true  Transportation Needs: No Transportation Needs  . Lack of Transportation (Medical): No  . Lack of Transportation (Non-Medical): No  Physical Activity:   . Days of Exercise per Week: Not on file  . Minutes of Exercise per Session: Not on file  Stress:   . Feeling of Stress : Not on file  Social  Connections: Slightly Isolated  . Frequency of Communication with Friends and Family: More than three times a week  . Frequency of Social Gatherings with Friends and Family: More than three times a week  . Attends Religious Services: More than 4 times per year  . Active Member of Clubs or Organizations: No  . Attends BankerClub or Organization Meetings: Never  . Marital Status: Married  Catering managerntimate Partner Violence: Not At Risk  . Fear of Current or Ex-Partner: No  . Emotionally Abused: No  . Physically Abused: No  . Sexually Abused: No      Review of Systems  Constitutional: Negative for fatigue.  HENT: Negative for dental problem, ear pain, hearing loss, rhinorrhea, sinus pain and sore throat.   Eyes: Positive for visual disturbance (Wears bifocals--vision stable.).  Respiratory: Negative for cough and shortness of breath.   Cardiovascular: Negative for chest pain, palpitations and leg swelling.  Gastrointestinal: Negative for abdominal pain, blood in stool (No melena), constipation and diarrhea.  Genitourinary: Negative for decreased urine volume, dysuria and frequency.  Musculoskeletal: Negative for arthralgias.  Skin: Negative for rash.  Neurological: Negative for weakness and numbness.  Psychiatric/Behavioral: Negative for dysphoric mood. The patient is not nervous/anxious.       Objective:   BP 130/70 (BP Location: Left Arm, Patient Position: Sitting, Cuff Size: Normal)   Pulse 72   Resp 12   Ht 5\' 3"  (1.6 m)   Wt 175 lb (79.4 kg)   BMI 31.00 kg/m   Physical Exam  Constitutional: He is oriented to person, place, and time. He appears well-developed and well-nourished.  HENT:  Head: Normocephalic and atraumatic.  Right Ear: Hearing, tympanic membrane, external ear and ear canal normal.  Left Ear: Hearing, tympanic membrane, external ear and ear canal normal.  Eyes: Pupils are equal, round, and reactive to light. Conjunctivae and EOM are normal.  Neck: Thyromegaly  (generous.  No mass,) present.  Cardiovascular: Normal rate, regular rhythm, S1 normal and S2 normal. Exam reveals no S3, no S4 and no friction rub.  No murmur heard. No carotid bruits.  Carotid, radial, femoral, DP and PT pulses normal and equal.   Pulmonary/Chest: Effort normal and breath sounds normal.  Abdominal: Soft. Bowel sounds are normal. He exhibits no mass. There is no hepatosplenomegaly. There is no abdominal tenderness. No hernia. Hernia confirmed negative in the right inguinal area and confirmed negative in the left inguinal area.  Genitourinary:    Penis normal.  Right testis shows no mass, no swelling and no tenderness. Right testis is descended. Left testis shows no mass, no swelling and no tenderness. Left testis is descended.    Genitourinary Comments: Does have larger collection of vessels extending into right scrotum.    Musculoskeletal:  General: No edema. Normal range of motion.     Cervical back: Full passive range of motion without pain, normal range of motion and neck supple.  Lymphadenopathy:       Head (right side): No submental and no submandibular adenopathy present.       Head (left side): No submental and no submandibular adenopathy present.    He has no cervical adenopathy.    He has no axillary adenopathy.       Right: No inguinal and no supraclavicular adenopathy present.       Left: No inguinal and no supraclavicular adenopathy present.  Neurological: He is alert and oriented to person, place, and time. He has normal strength and normal reflexes. No cranial nerve deficit or sensory deficit. Coordination and gait normal.  Skin: Skin is warm. No rash noted.  Psychiatric: He has a normal mood and affect. His speech is normal and behavior is normal. Judgment and thought content normal. Cognition and memory are normal.     Assessment & Plan   1.  CPE Guaiac cards x 3 to return in 2 weeks. HIV, FLP, A1C Flyers for free influenza vaccine clinic this  week given  2.  Prediabetes/weight issues:  Asked to continue to work on diet and physical activity  3.  Possible mild thyromegaly:  TSH  4.  Right varicocele:  To call if enlarges.

## 2019-01-17 LAB — LIPID PANEL W/O CHOL/HDL RATIO
Cholesterol, Total: 175 mg/dL (ref 100–199)
HDL: 51 mg/dL (ref >39)
LDL Chol Calc (NIH): 102 mg/dL — ABNORMAL HIGH (ref 0–99)
Triglycerides: 121 mg/dL (ref 0–149)
VLDL Cholesterol Cal: 22 mg/dL (ref 5–40)

## 2019-01-17 LAB — HIV ANTIBODY (ROUTINE TESTING W REFLEX): HIV Screen 4th Generation wRfx: NONREACTIVE

## 2019-01-17 LAB — TSH: TSH: 1.23 u[IU]/mL (ref 0.450–4.500)

## 2019-01-17 LAB — HGB A1C W/O EAG: Hgb A1c MFr Bld: 5.6 % (ref 4.8–5.6)

## 2019-05-29 ENCOUNTER — Inpatient Hospital Stay (HOSPITAL_COMMUNITY)
Admission: EM | Admit: 2019-05-29 | Discharge: 2019-06-01 | DRG: 193 | Disposition: A | Discharge status: Home / Self Care | Payer: Self-pay | Attending: Internal Medicine | Admitting: Internal Medicine

## 2019-05-29 ENCOUNTER — Emergency Department (HOSPITAL_COMMUNITY): Payer: Self-pay

## 2019-05-29 ENCOUNTER — Encounter (HOSPITAL_COMMUNITY): Payer: Self-pay | Admitting: Emergency Medicine

## 2019-05-29 ENCOUNTER — Other Ambulatory Visit: Payer: Self-pay

## 2019-05-29 DIAGNOSIS — L209 Atopic dermatitis, unspecified: Secondary | ICD-10-CM | POA: Diagnosis present

## 2019-05-29 DIAGNOSIS — J9601 Acute respiratory failure with hypoxia: Secondary | ICD-10-CM | POA: Diagnosis present

## 2019-05-29 DIAGNOSIS — E876 Hypokalemia: Secondary | ICD-10-CM | POA: Diagnosis present

## 2019-05-29 DIAGNOSIS — K648 Other hemorrhoids: Secondary | ICD-10-CM | POA: Diagnosis present

## 2019-05-29 DIAGNOSIS — K5790 Diverticulosis of intestine, part unspecified, without perforation or abscess without bleeding: Secondary | ICD-10-CM | POA: Diagnosis present

## 2019-05-29 DIAGNOSIS — J189 Pneumonia, unspecified organism: Principal | ICD-10-CM | POA: Diagnosis present

## 2019-05-29 DIAGNOSIS — Z9981 Dependence on supplemental oxygen: Secondary | ICD-10-CM

## 2019-05-29 DIAGNOSIS — Z20822 Contact with and (suspected) exposure to covid-19: Secondary | ICD-10-CM | POA: Diagnosis present

## 2019-05-29 DIAGNOSIS — R109 Unspecified abdominal pain: Secondary | ICD-10-CM | POA: Diagnosis present

## 2019-05-29 DIAGNOSIS — Z9109 Other allergy status, other than to drugs and biological substances: Secondary | ICD-10-CM

## 2019-05-29 DIAGNOSIS — R7303 Prediabetes: Secondary | ICD-10-CM | POA: Diagnosis present

## 2019-05-29 DIAGNOSIS — R0682 Tachypnea, not elsewhere classified: Secondary | ICD-10-CM

## 2019-05-29 DIAGNOSIS — Z833 Family history of diabetes mellitus: Secondary | ICD-10-CM

## 2019-05-29 LAB — COMPREHENSIVE METABOLIC PANEL
ALT: 98 U/L — ABNORMAL HIGH (ref 0–44)
AST: 66 U/L — ABNORMAL HIGH (ref 15–41)
Albumin: 2.8 g/dL — ABNORMAL LOW (ref 3.5–5.0)
Alkaline Phosphatase: 67 U/L (ref 38–126)
Anion gap: 9 (ref 5–15)
BUN: 13 mg/dL (ref 6–20)
CO2: 23 mmol/L (ref 22–32)
Calcium: 7.7 mg/dL — ABNORMAL LOW (ref 8.9–10.3)
Chloride: 104 mmol/L (ref 98–111)
Creatinine, Ser: 0.69 mg/dL (ref 0.61–1.24)
GFR calc Af Amer: >60 mL/min (ref >60)
GFR calc non Af Amer: >60 mL/min (ref >60)
Glucose, Bld: 129 mg/dL — ABNORMAL HIGH (ref 70–99)
Potassium: 3.4 mmol/L — ABNORMAL LOW (ref 3.5–5.1)
Sodium: 136 mmol/L (ref 135–145)
Total Bilirubin: 1.1 mg/dL (ref 0.3–1.2)
Total Protein: 7 g/dL (ref 6.5–8.1)

## 2019-05-29 LAB — URINALYSIS, ROUTINE W REFLEX MICROSCOPIC
Bacteria, UA: NONE SEEN
Bilirubin Urine: NEGATIVE
Glucose, UA: NEGATIVE mg/dL
Hgb urine dipstick: NEGATIVE
Ketones, ur: NEGATIVE mg/dL
Leukocytes,Ua: NEGATIVE
Nitrite: NEGATIVE
Protein, ur: 100 mg/dL — AB
Specific Gravity, Urine: 1.023 (ref 1.005–1.030)
pH: 7 (ref 5.0–8.0)

## 2019-05-29 LAB — CBC
HCT: 35.2 % — ABNORMAL LOW (ref 39.0–52.0)
Hemoglobin: 12 g/dL — ABNORMAL LOW (ref 13.0–17.0)
MCH: 30.6 pg (ref 26.0–34.0)
MCHC: 34.1 g/dL (ref 30.0–36.0)
MCV: 89.8 fL (ref 80.0–100.0)
Platelets: 253 10*3/uL (ref 150–400)
RBC: 3.92 MIL/uL — ABNORMAL LOW (ref 4.22–5.81)
RDW: 12.7 % (ref 11.5–15.5)
WBC: 7.5 10*3/uL (ref 4.0–10.5)
nRBC: 0 % (ref 0.0–0.2)

## 2019-05-29 LAB — RESPIRATORY PANEL BY RT PCR (FLU A&B, COVID)
Influenza A by PCR: NEGATIVE
Influenza B by PCR: NEGATIVE
SARS Coronavirus 2 by RT PCR: NEGATIVE

## 2019-05-29 LAB — LIPASE, BLOOD: Lipase: 39 U/L (ref 11–51)

## 2019-05-29 MED ORDER — SODIUM CHLORIDE 0.9 % IV SOLN
1.0000 g | Freq: Once | INTRAVENOUS | Status: AC
  Administered 2019-05-29: 1 g via INTRAVENOUS
  Filled 2019-05-29: qty 10

## 2019-05-29 MED ORDER — ALBUTEROL SULFATE HFA 108 (90 BASE) MCG/ACT IN AERS
2.0000 | INHALATION_SPRAY | Freq: Once | RESPIRATORY_TRACT | Status: AC
  Administered 2019-05-29: 2 via RESPIRATORY_TRACT
  Filled 2019-05-29: qty 6.7

## 2019-05-29 MED ORDER — SODIUM CHLORIDE 0.9% FLUSH: 3.0000 mL | Freq: Once | INTRAVENOUS | Status: DC

## 2019-05-29 MED ORDER — SODIUM CHLORIDE 0.9 % IV SOLN
500.0000 mg | Freq: Once | INTRAVENOUS | Status: AC
  Administered 2019-05-29: 500 mg via INTRAVENOUS
  Filled 2019-05-29: qty 500

## 2019-05-29 MED ORDER — ACETAMINOPHEN 325 MG PO TABS
650.0000 mg | ORAL_TABLET | Freq: Once | ORAL | Status: AC | PRN
  Administered 2019-05-29: 650 mg via ORAL
  Filled 2019-05-29: qty 2

## 2019-05-29 MED ORDER — METHYLPREDNISOLONE SODIUM SUCC 125 MG IJ SOLR
125.0000 mg | Freq: Once | INTRAMUSCULAR | Status: AC
  Administered 2019-05-29: 125 mg via INTRAVENOUS
  Filled 2019-05-29: qty 2

## 2019-05-29 NOTE — ED Triage Notes (Signed)
Pt c/o abd pains and cold symptoms. Denies n/v/, bowel or urinary problems. Reports that he been taking medications for his cold. Pt has fever in triage.

## 2019-05-29 NOTE — H&P (Signed)
History and Physical   Dennis Booth CBJ:[id number] DOB: 07/11/1965 DOA: 05/29/2019  Referring MD/NP/PA: Dr. Vanita Panda  PCP: Mack Hook, MD   Outpatient Specialists: None  Patient coming from: Home  Chief Complaint: Fever shortness of breath  HPI: Dennis Booth is a 54 y.o. male with medical history significant of prediabetes, diverticulosis and atopic dermatitis who is here with fever chills and generalized malaise for couple of days.  Mainly cold symptoms.  She has been taking medication over-the-counter with no change.  He was seen yesterday in one of the local pharmacies where he did Covid screening.  Results are still pending.  He came to the ER today with a temperature of 102 shortness of breath tachypneic as well as chills.  Patient suspected to have COVID-19 pneumonia but screening with PCR is negative.  His chest x-ray showed bilateral pneumonia.  He is being treated as community-acquired pneumonia.  He is currently on 2 L of oxygen but his sats are marginal on room air..  ED Course: Temperature 102.3 Booth pressure 123/64 pulse 97 respiratory rate of 39 oxygen sats 93% on 2 L.  White count 7.2 hemoglobin 12.1 platelets 253.  Sodium is 136 potassium 3.4 chloride 104.  Calcium 7.7 the rest of the chemistry appears to be within normal.  COVID-19 PCR negative.  Hemoglobin A1c 5.6.  Urinalysis essentially negative.  Chest x-ray shows findings consistent with multifocal pneumonia including potential COVID-19 etiology.  Patient initiated on Rocephin and Zithromax and being admitted for further evaluation.  Review of Systems: As per HPI otherwise 10 point review of systems negative.    Past Medical History:  Diagnosis Date  . Atopic dermatitis    Previously followed by Dr. Amy Martinique.  Tacrolimu 1% bid and Fluocinonide 0.05% bid, Claritin for itch  . Diverticulosis 11/27/2017   Dr. Penelope Coop, GI on diagnostic colonoscopy for rectal bleeding.  . Environmental and  seasonal allergies   . Internal hemorrhoids 11/27/2017   Dr. Penelope Coop on diagnostic colonoscopy for rectal bleeding.  . Prediabetes 2013    Past Surgical History:  Procedure Laterality Date  . COLONOSCOPY WITH PROPOFOL N/A 11/27/2017   Procedure: COLONOSCOPY WITH PROPOFOL;  Surgeon: Wonda Horner, MD;  Location: [facility];  Service: Endoscopy;  Laterality: N/A;     reports that he has never smoked. He has never used smokeless tobacco. He reports current alcohol use. He reports that he does not use drugs.  Allergies  Allergen Reactions  . Fruit & Vegetable Daily [Nutritional Supplements] Other (See Comments)    States when he eats any type of fruit and takes Tylenol at same time, gets irritation in his throat--drinks milk and gets better, but was prescribed Epipen for this in past--not clear why.    Family History  Problem Relation Age of Onset  . Diabetes Mother   . Diabetes Father      Prior to Admission medications   Medication Sig Start Date End Date Taking? Authorizing Provider  brompheniramine-pseudoephedrine-DM 30-2-10 MG/5ML syrup Take 10 mLs by mouth every 6 (six) hours as needed (cold symptoms).   Yes [provider]    Physical Exam: Vitals:   05/29/19 1730 05/29/19 1745 05/29/19 1830 05/29/19 1908  BP: 130/89 123/64 127/75   Pulse: 83 81 73 69  Resp: (!) 28 (!) 39 (!) 23   Temp:      TempSrc:      SpO2: 97% 95% 95% 94%  Weight:          Constitutional: Anxious, weak,  no acute distress Vitals:   05/29/19 1730 05/29/19 1745 05/29/19 1830 05/29/19 1908  BP: 130/89 123/64 127/75   Pulse: 83 81 73 69  Resp: (!) 28 (!) 39 (!) 23   Temp:      TempSrc:      SpO2: 97% 95% 95% 94%  Weight:       Eyes: PERRL, lids and conjunctivae normal ENMT: Mucous membranes are moist. Posterior pharynx clear of any exudate or lesions.Normal dentition.  Neck: normal, supple, no masses, no thyromegaly Respiratory: Tachypneic, decreased air entry with some  expiratory wheezing and basal crackles. No accessory muscle use.  Cardiovascular: Sinus tachycardia, no murmurs / rubs / gallops. No extremity edema. 2+ pedal pulses. No carotid bruits.  Abdomen: no tenderness, no masses palpated. No hepatosplenomegaly. Bowel sounds positive.  Musculoskeletal: no clubbing / cyanosis. No joint deformity upper and lower extremities. Good ROM, no contractures. Normal muscle tone.  Skin: no rashes, lesions, ulcers. No induration Neurologic: CN 2-12 grossly intact. Sensation intact, DTR normal. Strength 5/5 in all 4.  Psychiatric: Normal judgment and insight. Alert and oriented x 3. Normal mood.     Labs on Admission: I have personally reviewed following labs and imaging studies  CBC: Recent Labs  Lab 05/29/19 1643  WBC 7.5  HGB 12.0*  HCT 35.2*  MCV 89.8  PLT 253   Basic Metabolic Panel: Recent Labs  Lab 05/29/19 1643  NA 136  K 3.4*  CL 104  CO2 23  GLUCOSE 129*  BUN 13  CREATININE 0.69  CALCIUM 7.7*   GFR: Estimated Creatinine Clearance: 102.1 mL/min (by C-G formula based on SCr of 0.69 mg/dL). Liver Function Tests: Recent Labs  Lab 05/29/19 1643  AST 66*  ALT 98*  ALKPHOS 67  BILITOT 1.1  PROT 7.0  ALBUMIN 2.8*   Recent Labs  Lab 05/29/19 1643  LIPASE 39   No results for input(s): AMMONIA in the last 168 hours. Coagulation Profile: No results for input(s): INR, PROTIME in the last 168 hours. Cardiac Enzymes: No results for input(s): CKTOTAL, CKMB, CKMBINDEX, TROPONINI in the last 168 hours. BNP (last 3 results) No results for input(s): PROBNP in the last 8760 hours. HbA1C: No results for input(s): HGBA1C in the last 72 hours. CBG: No results for input(s): GLUCAP in the last 168 hours. Lipid Profile: No results for input(s): CHOL, HDL, LDLCALC, TRIG, CHOLHDL, LDLDIRECT in the last 72 hours. Thyroid Function Tests: No results for input(s): TSH, T4TOTAL, FREET4, T3FREE, THYROIDAB in the last 72 hours. Anemia Panel: No  results for input(s): VITAMINB12, FOLATE, FERRITIN, TIBC, IRON, RETICCTPCT in the last 72 hours. Urine analysis:    Component Value Date/Time   COLORURINE AMBER (A) 05/29/2019 1643   APPEARANCEUR CLEAR 05/29/2019 1643   LABSPEC 1.023 05/29/2019 1643   PHURINE 7.0 05/29/2019 1643   GLUCOSEU NEGATIVE 05/29/2019 1643   HGBUR NEGATIVE 05/29/2019 1643   BILIRUBINUR NEGATIVE 05/29/2019 1643   KETONESUR NEGATIVE 05/29/2019 1643   PROTEINUR 100 (A) 05/29/2019 1643   NITRITE NEGATIVE 05/29/2019 1643   LEUKOCYTESUR NEGATIVE 05/29/2019 1643   Sepsis Labs: @LABRCNTIP (procalcitonin:4,lacticidven:4) ) Recent Results (from the past 240 hour(s))  Respiratory Panel by RT PCR (Flu A&B, Covid) - Nasopharyngeal Swab     Status: None   Collection Time: 05/29/19  5:40 PM   Specimen: Nasopharyngeal Swab  Result Value Ref Range Status   SARS Coronavirus 2 by RT PCR NEGATIVE NEGATIVE Final    Comment: (NOTE) SARS-CoV-2 target nucleic acids are NOT DETECTED. The SARS-CoV-2  RNA is generally detectable in upper respiratoy specimens during the acute phase of infection. The lowest concentration of SARS-CoV-2 viral copies this assay can detect is 131 copies/mL. A negative result does not preclude SARS-Cov-2 infection and should not be used as the sole basis for treatment or other patient management decisions. A negative result may occur with  improper specimen collection/handling, submission of specimen other than nasopharyngeal swab, presence of viral mutation(s) within the areas targeted by this assay, and inadequate number of viral copies (<131 copies/mL). A negative result must be combined with clinical observations, patient history, and epidemiological information. The expected result is Negative. Fact Sheet for Patients:  https://www.moore.com/ Fact Sheet for Healthcare Providers:  https://www.young.biz/ This test is not yet ap proved or cleared by the [place] FDA and  has been authorized for detection and/or diagnosis of SARS-CoV-2 by FDA under an Emergency Use Authorization (EUA). This EUA will remain  in effect (meaning this test can be used) for the duration of the COVID-19 declaration under Section 564(b)(1) of the Act, 21 U.S.C. section 360bbb-3(b)(1), unless the authorization is terminated or revoked sooner.    Influenza A by PCR NEGATIVE NEGATIVE Final   Influenza B by PCR NEGATIVE NEGATIVE Final    Comment: (NOTE) The Xpert Xpress SARS-CoV-2/FLU/RSV assay is intended as an aid in  the diagnosis of influenza from Nasopharyngeal swab specimens and  should not be used as a sole basis for treatment. Nasal washings and  aspirates are unacceptable for Xpert Xpress SARS-CoV-2/FLU/RSV  testing. Fact Sheet for Patients: https://www.moore.com/ Fact Sheet for Healthcare Providers: https://www.young.biz/ This test is not yet approved or cleared by the [place] FDA and  has been authorized for detection and/or diagnosis of SARS-CoV-2 by  FDA under an Emergency Use Authorization (EUA). This EUA will remain  in effect (meaning this test can be used) for the duration of the  Covid-19 declaration under Section 564(b)(1) of the Act, 21  U.S.C. section 360bbb-3(b)(1), unless the authorization is  terminated or revoked. Performed at [facility], [address], [place], [place] [address]      Radiological Exams on Admission: DG Chest Portable 1 View  Result Date: 05/29/2019 CLINICAL DATA:  Shortness of breath, fever, never smoker EXAM: PORTABLE CHEST 1 VIEW COMPARISON:  None FINDINGS: Diffuse multifocal areas of airspace consolidation throughout both lungs. No pneumothorax or effusion. The cardiomediastinal contours are unremarkable. No acute osseous or soft tissue abnormality. Degenerative changes are present in the imaged spine and shoulders. Telemetry leads overlie the chest.  IMPRESSION: Features concerning for a multifocal pneumonia including potential COVID-19 etiology. Electronically Signed   By: Kreg Shropshire M.D.   On: 05/29/2019 18:56      Assessment/Plan Principal Problem:   CAP (community acquired pneumonia) Active Problems:   Prediabetes   Hypokalemia     #1 multifocal pneumonia: Highly suspicious for COVID-19 infection but PCR screen is negative.  Patient will be admitted for community-acquired pneumonia.  IV fluids as well as Rocephin and Zithromax.  Booth cultures and sputum cultures obtained.  Maintain standard precaution.  IV hydration.  #2 hypokalemia: Replete potassium.  #3 prediabetes: A1c is 5.6.  No further treatment.   DVT prophylaxis: Lovenox Code Status: Full code Family Communication: No family at bedside but interpreter used Disposition Plan: Home Consults called: None Admission status: Inpatient  Severity of Illness: The appropriate patient status for this patient is INPATIENT. Inpatient status is judged to be reasonable and necessary in order to provide the required  intensity of service to ensure the patient's safety. The patient's presenting symptoms, physical exam findings, and initial radiographic and laboratory data in the context of their chronic comorbidities is felt to place them at high risk for further clinical deterioration. Furthermore, it is not anticipated that the patient will be medically stable for discharge from the hospital within 2 midnights of admission. The following factors support the patient status of inpatient.   " The patient's presenting symptoms include fever chills or shortness of breath. " The worrisome physical exam findings include bilateral with expiratory wheezing with crackles. " The initial radiographic and laboratory data are worrisome because of bilateral pneumonia and tachypnea. " The chronic co-morbidities include none.   * I certify that at the point of admission it is my clinical  judgment that the patient will require inpatient hospital care spanning beyond 2 midnights from the point of admission due to high intensity of service, high risk for further deterioration and high frequency of surveillance required.Dennis Blood MD [facility] Pager [phone]  If 7PM-7AM, please contact night-coverage [email or web address] Password [facility]  05/29/2019, 7:43 PM

## 2019-05-29 NOTE — ED Provider Notes (Signed)
[facility]-EMERGENCY DEPT Provider Note   CSN: [id number] Arrival date & time: 05/29/19  1621     History Chief Complaint  Patient presents with  . Abdominal Pain  . Fever    Dennis Booth is a 54 y.o. male.  HPI  54 year old male, with a PMH of prediabetes, presents with cough, shortness breath, abdominal pain for the last 3 days.  Patient states he has been taking over-the-counter cough medicine.  He was seen yesterday and states he had a send out COVID-19 test that has not resulted yet.  He notes fever.  He denies any nausea, vomiting, diarrhea, chest pain.  He states abdominal pain is in the lower abdomen and feels like a ache.         Past Medical History:  Diagnosis Date  . Atopic dermatitis    Previously followed by Dr. Amy Swaziland.  Tacrolimu 1% bid and Fluocinonide 0.05% bid, Claritin for itch  . Diverticulosis 11/27/2017   Dr. Evette Cristal, GI on diagnostic colonoscopy for rectal bleeding.  . Environmental and seasonal allergies   . Internal hemorrhoids 11/27/2017   Dr. Evette Cristal on diagnostic colonoscopy for rectal bleeding.  . Prediabetes 2013    Patient Active Problem List   Diagnosis Date Noted  . Thyromegaly 01/16/2019  . Right varicocele 01/16/2019  . Environmental and seasonal allergies   . Diverticulosis 11/27/2017  . Internal hemorrhoids 11/27/2017  . Prediabetes 06/07/2015    Past Surgical History:  Procedure Laterality Date  . COLONOSCOPY WITH PROPOFOL N/A 11/27/2017   Procedure: COLONOSCOPY WITH PROPOFOL;  Surgeon: Graylin Shiver, MD;  Location: [facility];  Service: Endoscopy;  Laterality: N/A;       Family History  Problem Relation Age of Onset  . Diabetes Mother   . Diabetes Father     Social History   Tobacco Use  . Smoking status: Never Smoker  . Smokeless tobacco: Never Used  Substance Use Topics  . Alcohol use: Yes    Alcohol/week: 0.0 standard drinks    Comment: Beer occasionally  . Drug use: No      Home Medications Prior to Admission medications   Medication Sig Start Date End Date Taking? Authorizing Provider  brompheniramine-pseudoephedrine-DM 30-2-10 MG/5ML syrup Take 10 mLs by mouth every 6 (six) hours as needed (cold symptoms).   Yes [provider]    Allergies    Fruit & vegetable daily [nutritional supplements]  Review of Systems   Review of Systems  Constitutional: Negative for chills and fever.  Respiratory: Positive for cough and shortness of breath.   Cardiovascular: Negative for chest pain.  Gastrointestinal: Positive for abdominal pain. Negative for nausea and vomiting.  Genitourinary: Negative for dysuria.  All other systems reviewed and are negative.   Physical Exam Updated Vital Signs BP 127/75   Pulse 69   Temp (!) 102.3 F (39.1 C) (Oral)   Resp (!) 23   Wt 81.6 kg   SpO2 94%   BMI 31.89 kg/m   Physical Exam Vitals and nursing note reviewed.  Constitutional:      Appearance: He is well-developed.  HENT:     Head: Normocephalic and atraumatic.  Eyes:     Conjunctiva/sclera: Conjunctivae normal.  Cardiovascular:     Rate and Rhythm: Normal rate and regular rhythm.     Heart sounds: Normal heart sounds. No murmur.  Pulmonary:     Effort: Tachypnea present. No respiratory distress.     Breath sounds: Wheezing present. No rales.  Abdominal:  General: Bowel sounds are normal. There is no distension.     Palpations: Abdomen is soft.     Tenderness: There is no abdominal tenderness.  Musculoskeletal:        General: No tenderness or deformity. Normal range of motion.     Cervical back: Neck supple.  Skin:    General: Skin is warm and dry.     Findings: No erythema or rash.  Neurological:     Mental Status: He is alert and oriented to person, place, and time.  Psychiatric:        Behavior: Behavior normal.     ED Results / Procedures / Treatments   Labs (all labs ordered are listed, but only abnormal results are  displayed) Labs Reviewed  COMPREHENSIVE METABOLIC PANEL - Abnormal; Notable for the following components:      Result Value   Potassium 3.4 (*)    Glucose, Bld 129 (*)    Calcium 7.7 (*)    Albumin 2.8 (*)    AST 66 (*)    ALT 98 (*)    All other components within normal limits  CBC - Abnormal; Notable for the following components:   RBC 3.92 (*)    Hemoglobin 12.0 (*)    HCT 35.2 (*)    All other components within normal limits  URINALYSIS, ROUTINE W REFLEX MICROSCOPIC - Abnormal; Notable for the following components:   Color, Urine AMBER (*)    Protein, ur 100 (*)    All other components within normal limits  RESPIRATORY PANEL BY RT PCR (FLU A&B, COVID)  CULTURE, BLOOD (ROUTINE X 2)  CULTURE, BLOOD (ROUTINE X 2)  LIPASE, BLOOD    EKG None  Radiology DG Chest Portable 1 View  Result Date: 05/29/2019 CLINICAL DATA:  Shortness of breath, fever, never smoker EXAM: PORTABLE CHEST 1 VIEW COMPARISON:  None FINDINGS: Diffuse multifocal areas of airspace consolidation throughout both lungs. No pneumothorax or effusion. The cardiomediastinal contours are unremarkable. No acute osseous or soft tissue abnormality. Degenerative changes are present in the imaged spine and shoulders. Telemetry leads overlie the chest. IMPRESSION: Features concerning for a multifocal pneumonia including potential COVID-19 etiology. Electronically Signed   By: Lovena Le M.D.   On: 05/29/2019 18:56    Procedures Procedures (including critical care time)  Medications Ordered in ED Medications  sodium chloride flush (NS) 0.9 % injection 3 mL (3 mLs Intravenous Not Given 05/29/19 1654)  cefTRIAXone (ROCEPHIN) 1 g in sodium chloride 0.9 % 100 mL IVPB (has no administration in time range)  azithromycin (ZITHROMAX) 500 mg in sodium chloride 0.9 % 250 mL IVPB (has no administration in time range)  acetaminophen (TYLENOL) tablet 650 mg (650 mg Oral Given 05/29/19 1645)  albuterol (VENTOLIN HFA) 108 (90 Base)  MCG/ACT inhaler 2 puff (2 puffs Inhalation Given 05/29/19 1908)    ED Course  I have reviewed the triage vital signs and the nursing notes.  Pertinent labs & imaging results that were available during my care of the patient were reviewed by me and considered in my medical decision making (see chart for details).    MDM Rules/Calculators/A&P                       Patient presents with flulike symptoms, fever, shortness of breath, cough.  He also noted some abdominal pain.  He is tachypneic with expiratory wheezing on initial evaluation.  Per tech patient was satting between 90 and 92% on room air  and was placed on 2 L nasal cannula.  Abdomen soft and nontender palpation.  Patient febrile given Tylenol in triage.  Blood work without significant abnormality.  Chest x-ray with multifocal pneumonia, concern for COVID-19.  COVID-19 swab is negative.  However I have a high suspicion for COVID-19.  Started on antibiotics for community-acquired pneumonia including ceftriaxone and azithromycin.  Given albuterol and solumedrol for wheezing.  Given new O2 requirement, will discuss admission with hospitalist.   Dennis Booth was evaluated in Emergency Department on 05/29/2019 for the symptoms described in the history of present illness. He was evaluated in the context of the global COVID-19 pandemic, which necessitated consideration that the patient might be at risk for infection with the SARS-CoV-2 virus that causes COVID-19. Institutional protocols and algorithms that pertain to the evaluation of patients at risk for COVID-19 are in a state of rapid change based on information released by regulatory bodies including the CDC and federal and state organizations. These policies and algorithms were followed during the patient's care in the ED.     Final Clinical Impression(s) / ED Diagnoses Final diagnoses:  None    Rx / DC Orders ED Discharge Orders    None       Rueben Bash 05/29/19 2319    Gerhard Munch, MD 06/02/19 [phone]

## 2019-05-30 DIAGNOSIS — J189 Pneumonia, unspecified organism: Secondary | ICD-10-CM | POA: Diagnosis present

## 2019-05-30 LAB — RESPIRATORY PANEL BY RT PCR (FLU A&B, COVID)
Influenza A by PCR: NEGATIVE
Influenza B by PCR: NEGATIVE
SARS Coronavirus 2 by RT PCR: NEGATIVE

## 2019-05-30 LAB — RENAL FUNCTION PANEL
Albumin: 2.5 g/dL — ABNORMAL LOW (ref 3.5–5.0)
Anion gap: 8 (ref 5–15)
BUN: 18 mg/dL (ref 6–20)
CO2: 25 mmol/L (ref 22–32)
Calcium: 8.1 mg/dL — ABNORMAL LOW (ref 8.9–10.3)
Chloride: 108 mmol/L (ref 98–111)
Creatinine, Ser: 0.66 mg/dL (ref 0.61–1.24)
GFR calc Af Amer: >60 mL/min (ref >60)
GFR calc non Af Amer: >60 mL/min (ref >60)
Glucose, Bld: 167 mg/dL — ABNORMAL HIGH (ref 70–99)
Phosphorus: 3 mg/dL (ref 2.5–4.6)
Potassium: 3.9 mmol/L (ref 3.5–5.1)
Sodium: 141 mmol/L (ref 135–145)

## 2019-05-30 LAB — CREATININE, SERUM
Creatinine, Ser: 0.73 mg/dL (ref 0.61–1.24)
GFR calc Af Amer: >60 mL/min (ref >60)
GFR calc non Af Amer: >60 mL/min (ref >60)

## 2019-05-30 LAB — CBC
HCT: 37.7 % — ABNORMAL LOW (ref 39.0–52.0)
Hemoglobin: 12.4 g/dL — ABNORMAL LOW (ref 13.0–17.0)
MCH: 30.4 pg (ref 26.0–34.0)
MCHC: 32.9 g/dL (ref 30.0–36.0)
MCV: 92.4 fL (ref 80.0–100.0)
Platelets: 261 10*3/uL (ref 150–400)
RBC: 4.08 MIL/uL — ABNORMAL LOW (ref 4.22–5.81)
RDW: 12.8 % (ref 11.5–15.5)
WBC: 6 10*3/uL (ref 4.0–10.5)
nRBC: 0 % (ref 0.0–0.2)

## 2019-05-30 LAB — HIV ANTIBODY (ROUTINE TESTING W REFLEX): HIV Screen 4th Generation wRfx: NONREACTIVE

## 2019-05-30 LAB — STREP PNEUMONIAE URINARY ANTIGEN: Strep Pneumo Urinary Antigen: NEGATIVE

## 2019-05-30 LAB — MAGNESIUM: Magnesium: 2.3 mg/dL (ref 1.7–2.4)

## 2019-05-30 MED ORDER — SODIUM CHLORIDE 0.9 % IV SOLN: INTRAVENOUS | Status: DC

## 2019-05-30 MED ORDER — GUAIFENESIN ER 600 MG PO TB12
600.0000 mg | ORAL_TABLET | Freq: Two times a day (BID) | ORAL | Status: DC
  Administered 2019-05-30 – 2019-06-01 (×4): 600 mg via ORAL
  Filled 2019-05-30 (×5): qty 1

## 2019-05-30 MED ORDER — ENOXAPARIN SODIUM 40 MG/0.4ML ~~LOC~~ SOLN
40.0000 mg | Freq: Every day | SUBCUTANEOUS | Status: DC
  Administered 2019-05-30 – 2019-05-31 (×2): 40 mg via SUBCUTANEOUS
  Filled 2019-05-30 (×2): qty 0.4

## 2019-05-30 MED ORDER — SODIUM CHLORIDE 0.9 % IV SOLN
2.0000 g | INTRAVENOUS | Status: DC
  Administered 2019-05-30 – 2019-06-01 (×3): 2 g via INTRAVENOUS
  Filled 2019-05-30 (×3): qty 2

## 2019-05-30 MED ORDER — SODIUM CHLORIDE 0.9 % IV SOLN
500.0000 mg | INTRAVENOUS | Status: DC
  Administered 2019-05-30 – 2019-06-01 (×3): 500 mg via INTRAVENOUS
  Filled 2019-05-30 (×3): qty 500

## 2019-05-30 MED ORDER — ALBUTEROL SULFATE (2.5 MG/3ML) 0.083% IN NEBU: 2.5000 mg | INHALATION_SOLUTION | Freq: Four times a day (QID) | RESPIRATORY_TRACT | Status: DC | PRN

## 2019-05-30 NOTE — ED Notes (Signed)
Pt sitting up in bed. NAD noted. Pt denies any needs. Lights turned down for comfort. Will continue to monitor.

## 2019-05-30 NOTE — Progress Notes (Signed)
[facility]  PROGRESS NOTE    Dennis Booth  IDP:[id number] DOB: [birth year] DOA: 05/29/2019 PCP: Julieanne Manson, MD   Brief Narrative:   Dennis Booth is a 54 y.o. male with medical history significant of prediabetes, diverticulosis and atopic dermatitis who is here with fever chills and generalized malaise for couple of days.  Mainly cold symptoms.  She has been taking medication over-the-counter with no change.  He was seen yesterday in one of the local pharmacies where he did Covid screening.  Results are still pending.  He came to the ER today with a temperature of 102 shortness of breath tachypneic as well as chills.  Patient suspected to have COVID-19 pneumonia but screening with PCR is negative.  His chest x-ray showed bilateral pneumonia.  He is being treated as community-acquired pneumonia.  He is currently on 2 L of oxygen but his sats are marginal on room air.  4/23: Reports he had his first COVID test at [facility]. Unable to get those records. He's had two negative COVID screens here. CXR w/ multifocal PNA. Repeat renal fxn test to look at K+. Otherwise, continue rocephin/zithro and add PRN albuterol.    Assessment & Plan:   Principal Problem:   CAP (community acquired pneumonia) Active Problems:   Prediabetes   Hypokalemia   Multifocal pneumonia  Multifocal pneumonia (CAP)     - Patient will be admitted for community-acquired pneumonia.     - COVID neg x 2 @ [facility]       - IV fluids as well as Rocephin and Zithromax.     - add mucinex       - Bld Cx NTD      - sputum cultures obtained     - add PRN albuterol  Hypokalemia     - replete, check Mg2+ monitor  Prediabetes     - A1c is 5.6. No further treatment.  DVT prophylaxis: lovenox Code Status: FULL   Status is: Inpatient  Remains inpatient appropriate because:Inpatient level of care appropriate due to severity of illness   Dispo: The patient is from: Home              Anticipated d/c is to: Home         Anticipated d/c date is: 2 days              Patient currently is not medically stable to d/c.  Consultants:   None  Procedures:   None  Antimicrobials:  . Rocephin, azithro   ROS:  Denis CP, N, V, ab pain. Reports dyspnea. Remainder 10-pt ROS is negative for all not previously mentioned.  Subjective: Reporting dyspnea  Objective: Vitals:   05/30/19 1218 05/30/19 1219 05/30/19 1220 05/30/19 1259  BP:    111/77  Pulse: (!) 53 (!) 56 (!) 57 62  Resp:    20  Temp:    98.4 F (36.9 C)  TempSrc:      SpO2: 94% 92% 93% 97%  Weight:    78.3 kg  Height:    5\' 4"  (1.626 m)    Intake/Output Summary (Last 24 hours) at 05/30/2019 1454 Last data filed at 05/29/2019 2057 Gross per 24 hour  Intake 280.06 ml  Output --  Net 280.06 ml   Filed Weights   05/29/19 1629 05/30/19 1259  Weight: 81.6 kg 78.3 kg    Examination:  General: 54 y.o. male resting in bed in NAD Cardiovascular: RRR, +S1, S2, no m/g/r, equal pulses throughout Respiratory: decreased at bases,  b/l rhonchi , normal WOB GI: BS+, NDNT, no masses noted, no organomegaly noted MSK: No e/c/c Skin: No rashes, bruises, ulcerations noted Neuro: alert and following commands Psyc: calm/cooperative  Data Reviewed: I have personally reviewed following labs and imaging studies.  CBC: Recent Labs  Lab 05/29/19 1643 05/30/19 0130  WBC 7.5 6.0  HGB 12.0* 12.4*  HCT 35.2* 37.7*  MCV 89.8 92.4  PLT 253 261   Basic Metabolic Panel: Recent Labs  Lab 05/29/19 1643 05/30/19 0130  NA 136  --   K 3.4*  --   CL 104  --   CO2 23  --   GLUCOSE 129*  --   BUN 13  --   CREATININE 0.69 0.73  CALCIUM 7.7*  --    GFR: Estimated Creatinine Clearance: 102.1 mL/min (by C-G formula based on SCr of 0.73 mg/dL). Liver Function Tests: Recent Labs  Lab 05/29/19 1643  AST 66*  ALT 98*  ALKPHOS 67  BILITOT 1.1  PROT 7.0  ALBUMIN 2.8*   Recent Labs  Lab 05/29/19 1643  LIPASE 39   No results for input(s):  AMMONIA in the last 168 hours. Coagulation Profile: No results for input(s): INR, PROTIME in the last 168 hours. Cardiac Enzymes: No results for input(s): CKTOTAL, CKMB, CKMBINDEX, TROPONINI in the last 168 hours. BNP (last 3 results) No results for input(s): PROBNP in the last 8760 hours. HbA1C: No results for input(s): HGBA1C in the last 72 hours. CBG: No results for input(s): GLUCAP in the last 168 hours. Lipid Profile: No results for input(s): CHOL, HDL, LDLCALC, TRIG, CHOLHDL, LDLDIRECT in the last 72 hours. Thyroid Function Tests: No results for input(s): TSH, T4TOTAL, FREET4, T3FREE, THYROIDAB in the last 72 hours. Anemia Panel: No results for input(s): VITAMINB12, FOLATE, FERRITIN, TIBC, IRON, RETICCTPCT in the last 72 hours. Sepsis Labs: No results for input(s): PROCALCITON, LATICACIDVEN in the last 168 hours.  Recent Results (from the past 240 hour(s))  Respiratory Panel by RT PCR (Flu A&B, Covid) - Nasopharyngeal Swab     Status: None   Collection Time: 05/29/19  5:40 PM   Specimen: Nasopharyngeal Swab  Result Value Ref Range Status   SARS Coronavirus 2 by RT PCR NEGATIVE NEGATIVE Final    Comment: (NOTE) SARS-CoV-2 target nucleic acids are NOT DETECTED. The SARS-CoV-2 RNA is generally detectable in upper respiratoy specimens during the acute phase of infection. The lowest concentration of SARS-CoV-2 viral copies this assay can detect is 131 copies/mL. A negative result does not preclude SARS-Cov-2 infection and should not be used as the sole basis for treatment or other patient management decisions. A negative result may occur with  improper specimen collection/handling, submission of specimen other than nasopharyngeal swab, presence of viral mutation(s) within the areas targeted by this assay, and inadequate number of viral copies (<131 copies/mL). A negative result must be combined with clinical observations, patient history, and epidemiological information.  The expected result is Negative. Fact Sheet for Patients:  https://www.moore.com/ Fact Sheet for Healthcare Providers:  https://www.young.biz/ This test is not yet ap proved or cleared by the [place] FDA and  has been authorized for detection and/or diagnosis of SARS-CoV-2 by FDA under an Emergency Use Authorization (EUA). This EUA will remain  in effect (meaning this test can be used) for the duration of the COVID-19 declaration under Section 564(b)(1) of the Act, 21 U.S.C. section 360bbb-3(b)(1), unless the authorization is terminated or revoked sooner.    Influenza A by PCR NEGATIVE NEGATIVE Final  Influenza B by PCR NEGATIVE NEGATIVE Final    Comment: (NOTE) The Xpert Xpress SARS-CoV-2/FLU/RSV assay is intended as an aid in  the diagnosis of influenza from Nasopharyngeal swab specimens and  should not be used as a sole basis for treatment. Nasal washings and  aspirates are unacceptable for Xpert Xpress SARS-CoV-2/FLU/RSV  testing. Fact Sheet for Patients: https://www.moore.com/ Fact Sheet for Healthcare Providers: https://www.young.biz/ This test is not yet approved or cleared by the [place] FDA and  has been authorized for detection and/or diagnosis of SARS-CoV-2 by  FDA under an Emergency Use Authorization (EUA). This EUA will remain  in effect (meaning this test can be used) for the duration of the  Covid-19 declaration under Section 564(b)(1) of the Act, 21  U.S.C. section 360bbb-3(b)(1), unless the authorization is  terminated or revoked. Performed at [facility], [address] [place], [place] [address]   Blood culture (routine x 2)     Status: None (Preliminary result)   Collection Time: 05/29/19  7:46 PM   Specimen: BLOOD RIGHT FOREARM  Result Value Ref Range Status   Specimen Description   Final    BLOOD RIGHT FOREARM Performed at [facility], [address], [place], [place] [address]    Special Requests   Final    BOTTLES DRAWN AEROBIC AND ANAEROBIC Blood Culture adequate volume Performed at [facility], [address] [place], [place] [address]    Culture   Final    NO GROWTH < 24 HOURS Performed at [facility], [address] [place], [place] [address]    Report Status PENDING  Incomplete  Blood culture (routine x 2)     Status: None (Preliminary result)   Collection Time: 05/29/19  7:55 PM   Specimen: BLOOD LEFT FOREARM  Result Value Ref Range Status   Specimen Description   Final    BLOOD LEFT FOREARM Performed at [facility], [address], [place], [place] [address]    Special Requests   Final    BOTTLES DRAWN AEROBIC AND ANAEROBIC Blood Culture adequate volume Performed at [facility], [address], [place], [place] [address]    Culture   Final    NO GROWTH < 24 HOURS Performed at [facility], [address], [place], [place] [address]    Report Status PENDING  Incomplete  Culture, blood (routine x 2) Call MD if unable to obtain prior to antibiotics being given     Status: None (Preliminary result)   Collection Time: 05/30/19  1:30 AM   Specimen: BLOOD  Result Value Ref Range Status   Specimen Description   Final    BLOOD LEFT ANTECUBITAL Performed at [facility], [address], [place], [place] [address]    Special Requests   Final    BOTTLES DRAWN AEROBIC AND ANAEROBIC Blood Culture adequate volume Performed at [facility], [address] [place], [place] [address]    Culture   Final    NO GROWTH < 12 HOURS Performed at [facility], [address], [place], [place] [address]    Report Status PENDING  Incomplete  Respiratory Panel by RT PCR (Flu A&B, Covid) - Nasopharyngeal Swab     Status: None   Collection Time: 05/30/19 10:17 AM   Specimen: Nasopharyngeal Swab   Result Value Ref Range Status   SARS Coronavirus 2 by RT PCR NEGATIVE NEGATIVE Final  Comment: (NOTE) SARS-CoV-2 target nucleic acids are NOT DETECTED. The SARS-CoV-2 RNA is generally detectable in upper respiratoy specimens during the acute phase of infection. The lowest concentration of SARS-CoV-2 viral copies this assay can detect is 131 copies/mL. A negative result does not preclude SARS-Cov-2 infection and should not be used as the sole basis for treatment or other patient management decisions. A negative result may occur with  improper specimen collection/handling, submission of specimen other than nasopharyngeal swab, presence of viral mutation(s) within the areas targeted by this assay, and inadequate number of viral copies (<131 copies/mL). A negative result must be combined with clinical observations, patient history, and epidemiological information. The expected result is Negative. Fact Sheet for Patients:  PinkCheek.be Fact Sheet for Healthcare Providers:  GravelBags.it This test is not yet ap proved or cleared by the [place] FDA and  has been authorized for detection and/or diagnosis of SARS-CoV-2 by FDA under an Emergency Use Authorization (EUA). This EUA will remain  in effect (meaning this test can be used) for the duration of the COVID-19 declaration under Section 564(b)(1) of the Act, 21 U.S.C. section 360bbb-3(b)(1), unless the authorization is terminated or revoked sooner.    Influenza A by PCR NEGATIVE NEGATIVE Final   Influenza B by PCR NEGATIVE NEGATIVE Final    Comment: (NOTE) The Xpert Xpress SARS-CoV-2/FLU/RSV assay is intended as an aid in  the diagnosis of influenza from Nasopharyngeal swab specimens and  should not be used as a sole basis for treatment. Nasal washings and  aspirates are unacceptable for Xpert Xpress SARS-CoV-2/FLU/RSV  testing. Fact Sheet for  Patients: PinkCheek.be Fact Sheet for Healthcare Providers: GravelBags.it This test is not yet approved or cleared by the [place] FDA and  has been authorized for detection and/or diagnosis of SARS-CoV-2 by  FDA under an Emergency Use Authorization (EUA). This EUA will remain  in effect (meaning this test can be used) for the duration of the  Covid-19 declaration under Section 564(b)(1) of the Act, 21  U.S.C. section 360bbb-3(b)(1), unless the authorization is  terminated or revoked. Performed at [facility], [place] [address] [place], [place] [address]       Radiology Studies: DG Chest Portable 1 View  Result Date: 05/29/2019 CLINICAL DATA:  Shortness of breath, fever, never smoker EXAM: PORTABLE CHEST 1 VIEW COMPARISON:  None FINDINGS: Diffuse multifocal areas of airspace consolidation throughout both lungs. No pneumothorax or effusion. The cardiomediastinal contours are unremarkable. No acute osseous or soft tissue abnormality. Degenerative changes are present in the imaged spine and shoulders. Telemetry leads overlie the chest. IMPRESSION: Features concerning for a multifocal pneumonia including potential COVID-19 etiology. Electronically Signed   By: Lovena Le M.D.   On: 05/29/2019 18:56     Scheduled Meds: . enoxaparin (LOVENOX) injection  40 mg Subcutaneous QHS  . sodium chloride flush  3 mL Intravenous Once   Continuous Infusions: . sodium chloride 125 mL/hr at 05/30/19 1449  . azithromycin    . cefTRIAXone (ROCEPHIN)  IV       LOS: 1 day    Time spent: 25 minutes spent in the coordination of care today.    Jonnie Finner, DO [facility]  If 7PM-7AM, please contact night-coverage [email or web address] 05/30/2019, 2:54 PM

## 2019-05-30 NOTE — Progress Notes (Signed)
Received patient from ED, VS obtained, telemetry monitor applied, oriented to unit and call light placed in reach

## 2019-05-30 NOTE — ED Notes (Addendum)
Ronaldo Miyamoto, DO paged and updated that patients 2nd covid test was negative. AC made aware.    Per Ronaldo Miyamoto, DO patient can be downgraded to tele for 4E.

## 2019-05-30 NOTE — ED Notes (Signed)
Pt lying in bed. Tech at bedside. NAD noted. Will continue to monitor.

## 2019-05-30 NOTE — ED Notes (Signed)
Pt lying in bed, eye's closed, chest rising and falling. NAD noted. Will continue to monitor.  

## 2019-05-30 NOTE — ED Notes (Signed)
Spoke with Teddy Spike, DO  Patient reports some shob.  Patient drops down to 88% on 3 liters intermittently. Patient moved up to 3.5 liters at 90%.   At this time Attending is okay with 02 saturations and will come by to see him.

## 2019-05-30 NOTE — ED Notes (Signed)
Ronaldo Miyamoto, DO at bedside.

## 2019-05-30 NOTE — ED Notes (Addendum)
Spoke to [employer]. W Veterinary surgeon. Assistant states results are not back and may be back this afternoon or tomorrow.   This RN paged Ronaldo Miyamoto, DO and updated on information.

## 2019-05-30 NOTE — ED Notes (Addendum)
Patient states he got his covid test @ [facility]. W Veterinary surgeon.  This RN called and left voicemail for call back.

## 2019-05-30 NOTE — ED Notes (Deleted)
Pt lying in bed. NAD noted. Pt denies any needs. Will continue to monitor.  

## 2019-05-30 NOTE — ED Notes (Signed)
Pt lying in bed, eye's closed, chest rising and falling. Pt awakens easily. Denies any needs. Will continue to monitor. 

## 2019-05-31 LAB — BRAIN NATRIURETIC PEPTIDE: B Natriuretic Peptide: 196.1 pg/mL — ABNORMAL HIGH (ref 0.0–100.0)

## 2019-05-31 MED ORDER — FUROSEMIDE 10 MG/ML IJ SOLN
20.0000 mg | Freq: Once | INTRAMUSCULAR | Status: AC
  Administered 2019-05-31: 15:00:00 20 mg via INTRAVENOUS
  Filled 2019-05-31: qty 2

## 2019-05-31 MED ORDER — GUAIFENESIN-DM 100-10 MG/5ML PO SYRP
5.0000 mL | ORAL_SOLUTION | Freq: Four times a day (QID) | ORAL | Status: DC | PRN
  Administered 2019-05-31: 5 mL via ORAL
  Filled 2019-05-31: qty 10

## 2019-05-31 NOTE — Progress Notes (Signed)
Dr. Isidoro Donning aware pt requested cough medication. New order received. See chart.

## 2019-05-31 NOTE — Progress Notes (Addendum)
[facility]                                                                              Patient Demographics  Dennis Booth, is a 54 y.o. male, DOB - [birth year], SWH:[id number]  Admit date - 05/29/2019   Admitting Physician Teddy Spike, DO  Outpatient Primary MD for the patient is Julieanne Manson, MD  Outpatient specialists:   LOS - 2  days   Medical records reviewed and are as summarized below:    Chief Complaint  Patient presents with  . Abdominal Pain  . Fever       Brief summary   Patient is a 54 year old male with history of prediabetes, diverticulosis, atopic dermatitis, presented with fever chills, generalized malaise, abdominal pain for 3 days prior to admission.  Patient was noticed to have a temp of 102.3 F, shortness of breath, tachypnea, chills in ED.  O2 sats 93% on 2 L Chest x-ray showed multifocal pneumonia COVID-19 t x2 during this hospitalization negative Reports he had his first COVID test at [facility], per patient he received a call from them on 4/23 and it was negative  Assessment & Plan    Principal Problem: Acute respiratory failure with hypoxia secondary to multifocal CAP (community acquired pneumonia) - COVID-19 test negative x2, per patient outpatient test at the clinic was negative.  Urine strep antigen negative,  influenza negative.  Follow urine Legionella antigen -Chest x-ray showed multifocal pneumonia -At baseline not on any O2, O2 sats 94%, weaned to 3 L O2 -If develops worsening shortness of breath, hypoxia or fevers, leukocytosis, will obtain CT chest -Continue IV Rocephin, Zithromax, blood cultures negative. -Home O2 evaluation prior to discharge -Still persistent hypoxia, DC IV fluids, I's and O's with 2.8 L positive, check BNP, repeat chest x-ray in am if no significant improvement Addendum: BNP slightly elevated 196, will give lasix 20mg  Iv x1  Active Problems:   Prediabetes -Hemoglobin A1c 5.6   Hypokalemia -Resolved  code Status: Full code DVT Prophylaxis: Lovenox Family Communication: Discussed all imaging results, lab results, explained to the patient    Disposition Plan:     Status is: Inpatient  Remains inpatient appropriate because:IV treatments appropriate due to intensity of illness or inability to take PO, persistent hypoxia, still on 3 L, weaning   Dispo: The patient is from: Home              Anticipated d/c is to: Home              Anticipated d/c date is: 2 days              Patient currently is not medically stable to d/c.   Time Spent in minutes 35 minutes  Procedures:  None  Consultants:   None  Antimicrobials:   Anti-infectives (From admission, onward)   Start     Dose/Rate Route Frequency Ordered Stop   05/30/19 1800  cefTRIAXone (ROCEPHIN) 2 g in sodium chloride 0.9 % 100 mL IVPB     2 g 200 mL/hr over 30 Minutes Intravenous Every 24 hours 05/30/19 0110 06/04/19 1759   05/30/19 0109  azithromycin (ZITHROMAX) 500 mg in sodium chloride 0.9 % 250 mL IVPB     500 mg 250 mL/hr over 60 Minutes Intravenous Every 24 hours 05/30/19 0110 06/04/19 1959   05/29/19 1930  cefTRIAXone (ROCEPHIN) 1 g in sodium chloride 0.9 % 100 mL IVPB     1 g 200 mL/hr over 30 Minutes Intravenous  Once 05/29/19 1928 05/29/19 2018   05/29/19 1930  azithromycin (ZITHROMAX) 500 mg in sodium chloride 0.9 % 250 mL IVPB     500 mg 250 mL/hr over 60 Minutes Intravenous  Once 05/29/19 1928 05/29/19 2057          Medications  Scheduled Meds: . enoxaparin (LOVENOX) injection  40 mg Subcutaneous QHS  . guaiFENesin  600 mg Oral BID  . sodium chloride flush  3 mL Intravenous Once   Continuous Infusions: . sodium chloride 125 mL/hr at 05/31/19 0936  . azithromycin 500 mg (05/30/19 2034)  . cefTRIAXone (ROCEPHIN)  IV 2 g (05/30/19 1717)   PRN Meds:.albuterol      Subjective:   Dennis Booth was seen and examined today.  Still has hypoxia, was on 4 L O2  this morning, wean to 3 L.  Still has shortness of breath.  No fevers or chills. Patient denies dizziness, chest pain, abdominal pain, N/V/D/C, new weakness, numbess, tingling. No acute events overnight.    Objective:   Vitals:   05/31/19 0127 05/31/19 0526 05/31/19 0654 05/31/19 0803  BP: 105/71 101/64    Pulse: (!) 59 (!) 53 (!) 53   Resp: 18 18    Temp: 98 F (36.7 C) (!) 97.5 F (36.4 C)    TempSrc: Oral Oral    SpO2: 95% (!) 89% 96% 94%  Weight:      Height:        Intake/Output Summary (Last 24 hours) at 05/31/2019 1152 Last data filed at 05/31/2019 0600 Gross per 24 hour  Intake 3001.8 ml  Output 450 ml  Net 2551.8 ml     Wt Readings from Last 3 Encounters:  05/30/19 78.3 kg  01/16/19 79.4 kg  07/15/18 78.9 kg     Exam  General: Alert and oriented x 3, NAD  Cardiovascular: S1 S2 auscultated, no murmurs, RRR  Respiratory: Coarse breath sounds bilaterally  Gastrointestinal: Soft, nontender, nondistended, + bowel sounds  Ext: no pedal edema bilaterally  Neuro: No new deficits  Musculoskeletal: No digital cyanosis, clubbing  Skin: No rashes  Psych: Normal affect and demeanor, alert and oriented x3    Data Reviewed:  I have personally reviewed following labs and imaging studies  Micro Results Recent Results (from the past 240 hour(s))  Respiratory Panel by RT PCR (Flu A&B, Covid) - Nasopharyngeal Swab     Status: None   Collection Time: 05/29/19  5:40 PM   Specimen: Nasopharyngeal Swab  Result Value Ref Range Status   SARS Coronavirus 2 by RT PCR NEGATIVE NEGATIVE Final    Comment: (NOTE) SARS-CoV-2 target nucleic acids are NOT DETECTED. The SARS-CoV-2 RNA is generally detectable in upper respiratoy specimens during the acute phase of infection. The lowest concentration of SARS-CoV-2 viral copies this assay can detect is 131 copies/mL. A negative result does not preclude SARS-Cov-2 infection and should not be used as the sole basis for treatment  or other patient management decisions. A negative result may occur with  improper specimen collection/handling, submission of specimen other than nasopharyngeal swab, presence of viral mutation(s) within the areas targeted by this assay, and inadequate number  of viral copies (<131 copies/mL). A negative result must be combined with clinical observations, patient history, and epidemiological information. The expected result is Negative. Fact Sheet for Patients:  https://www.moore.com/ Fact Sheet for Healthcare Providers:  https://www.young.biz/ This test is not yet ap proved or cleared by the [place] FDA and  has been authorized for detection and/or diagnosis of SARS-CoV-2 by FDA under an Emergency Use Authorization (EUA). This EUA will remain  in effect (meaning this test can be used) for the duration of the COVID-19 declaration under Section 564(b)(1) of the Act, 21 U.S.C. section 360bbb-3(b)(1), unless the authorization is terminated or revoked sooner.    Influenza A by PCR NEGATIVE NEGATIVE Final   Influenza B by PCR NEGATIVE NEGATIVE Final    Comment: (NOTE) The Xpert Xpress SARS-CoV-2/FLU/RSV assay is intended as an aid in  the diagnosis of influenza from Nasopharyngeal swab specimens and  should not be used as a sole basis for treatment. Nasal washings and  aspirates are unacceptable for Xpert Xpress SARS-CoV-2/FLU/RSV  testing. Fact Sheet for Patients: https://www.moore.com/ Fact Sheet for Healthcare Providers: https://www.young.biz/ This test is not yet approved or cleared by the [place] FDA and  has been authorized for detection and/or diagnosis of SARS-CoV-2 by  FDA under an Emergency Use Authorization (EUA). This EUA will remain  in effect (meaning this test can be used) for the duration of the  Covid-19 declaration under Section 564(b)(1) of the Act, 21  U.S.C. section  360bbb-3(b)(1), unless the authorization is  terminated or revoked. Performed at [facility], [address] [place], [place] [address]   Blood culture (routine x 2)     Status: None (Preliminary result)   Collection Time: 05/29/19  7:46 PM   Specimen: BLOOD RIGHT FOREARM  Result Value Ref Range Status   Specimen Description   Final    BLOOD RIGHT FOREARM Performed at [facility], [address], [place], [place] [address]    Special Requests   Final    BOTTLES DRAWN AEROBIC AND ANAEROBIC Blood Culture adequate volume Performed at [facility], [address], [place], [place] [address]    Culture   Final    NO GROWTH < 24 HOURS Performed at [facility], [address], [place], [place] [address]    Report Status PENDING  Incomplete  Blood culture (routine x 2)     Status: None (Preliminary result)   Collection Time: 05/29/19  7:55 PM   Specimen: BLOOD LEFT FOREARM  Result Value Ref Range Status   Specimen Description   Final    BLOOD LEFT FOREARM Performed at [facility], [address], [place], [place] [address]    Special Requests   Final    BOTTLES DRAWN AEROBIC AND ANAEROBIC Blood Culture adequate volume Performed at [facility], [address], [place], [place] [address]    Culture   Final    NO GROWTH < 24 HOURS Performed at [facility], [address] [place], [place] [address]    Report Status PENDING  Incomplete  Culture, blood (routine x 2) Call MD if unable to obtain prior to antibiotics being given     Status: None (Preliminary result)   Collection Time: 05/30/19  1:30 AM   Specimen: BLOOD  Result Value Ref Range Status   Specimen Description   Final    BLOOD LEFT ANTECUBITAL Performed at [facility], [address]  [place], [place] [address]    Special Requests   Final    BOTTLES DRAWN AEROBIC AND ANAEROBIC Blood Culture  adequate volume Performed at [facility], [address], [place], [place] [address]    Culture   Final    NO GROWTH < 12 HOURS Performed at [facility], [address] [place], [place] [address]    Report Status PENDING  Incomplete  Respiratory Panel by RT PCR (Flu A&B, Covid) - Nasopharyngeal Swab     Status: None   Collection Time: 05/30/19 10:17 AM   Specimen: Nasopharyngeal Swab  Result Value Ref Range Status   SARS Coronavirus 2 by RT PCR NEGATIVE NEGATIVE Final    Comment: (NOTE) SARS-CoV-2 target nucleic acids are NOT DETECTED. The SARS-CoV-2 RNA is generally detectable in upper respiratoy specimens during the acute phase of infection. The lowest concentration of SARS-CoV-2 viral copies this assay can detect is 131 copies/mL. A negative result does not preclude SARS-Cov-2 infection and should not be used as the sole basis for treatment or other patient management decisions. A negative result may occur with  improper specimen collection/handling, submission of specimen other than nasopharyngeal swab, presence of viral mutation(s) within the areas targeted by this assay, and inadequate number of viral copies (<131 copies/mL). A negative result must be combined with clinical observations, patient history, and epidemiological information. The expected result is Negative. Fact Sheet for Patients:  https://www.moore.com/ Fact Sheet for Healthcare Providers:  https://www.young.biz/ This test is not yet ap proved or cleared by the [place] FDA and  has been authorized for detection and/or diagnosis of SARS-CoV-2 by FDA under an Emergency Use Authorization (EUA). This EUA will remain  in effect (meaning this test can be used) for the duration of the COVID-19 declaration under Section 564(b)(1) of the Act, 21 U.S.C. section 360bbb-3(b)(1), unless the authorization is terminated or revoked sooner.    Influenza  A by PCR NEGATIVE NEGATIVE Final   Influenza B by PCR NEGATIVE NEGATIVE Final    Comment: (NOTE) The Xpert Xpress SARS-CoV-2/FLU/RSV assay is intended as an aid in  the diagnosis of influenza from Nasopharyngeal swab specimens and  should not be used as a sole basis for treatment. Nasal washings and  aspirates are unacceptable for Xpert Xpress SARS-CoV-2/FLU/RSV  testing. Fact Sheet for Patients: https://www.moore.com/ Fact Sheet for Healthcare Providers: https://www.young.biz/ This test is not yet approved or cleared by the [place] FDA and  has been authorized for detection and/or diagnosis of SARS-CoV-2 by  FDA under an Emergency Use Authorization (EUA). This EUA will remain  in effect (meaning this test can be used) for the duration of the  Covid-19 declaration under Section 564(b)(1) of the Act, 21  U.S.C. section 360bbb-3(b)(1), unless the authorization is  terminated or revoked. Performed at [facility], [address], [place], [place] [address]     Radiology Reports DG Chest Portable 1 View  Result Date: 05/29/2019 CLINICAL DATA:  Shortness of breath, fever, never smoker EXAM: PORTABLE CHEST 1 VIEW COMPARISON:  None FINDINGS: Diffuse multifocal areas of airspace consolidation throughout both lungs. No pneumothorax or effusion. The cardiomediastinal contours are unremarkable. No acute osseous or soft tissue abnormality. Degenerative changes are present in the imaged spine and shoulders. Telemetry leads overlie the chest. IMPRESSION: Features concerning for a multifocal pneumonia including potential COVID-19 etiology. Electronically Signed   By: Kreg Shropshire M.D.   On: 05/29/2019 18:56    Lab Data:  CBC: Recent Labs  Lab 05/29/19 1643 05/30/19 0130  WBC 7.5 6.0  HGB 12.0* 12.4*  HCT 35.2* 37.7*  MCV 89.8 92.4  PLT 253 261   Basic Metabolic Panel: Recent Labs  Lab 05/29/19 1643 05/30/19 0130  05/30/19 1517  NA 136  --  141  K 3.4*  --  3.9  CL 104  --  108  CO2 23  --  25  GLUCOSE 129*  --  167*  BUN 13  --  18  CREATININE 0.69 0.73 0.66  CALCIUM 7.7*  --  8.1*  MG  --   --  2.3  PHOS  --   --  3.0   GFR: Estimated Creatinine Clearance: 102.1 mL/min (by C-G formula based on SCr of 0.66 mg/dL). Liver Function Tests: Recent Labs  Lab 05/29/19 1643 05/30/19 1517  AST 66*  --   ALT 98*  --   ALKPHOS 67  --   BILITOT 1.1  --   PROT 7.0  --   ALBUMIN 2.8* 2.5*   Recent Labs  Lab 05/29/19 1643  LIPASE 39   No results for input(s): AMMONIA in the last 168 hours. Coagulation Profile: No results for input(s): INR, PROTIME in the last 168 hours. Cardiac Enzymes: No results for input(s): CKTOTAL, CKMB, CKMBINDEX, TROPONINI in the last 168 hours. BNP (last 3 results) No results for input(s): PROBNP in the last 8760 hours. HbA1C: No results for input(s): HGBA1C in the last 72 hours. CBG: No results for input(s): GLUCAP in the last 168 hours. Lipid Profile: No results for input(s): CHOL, HDL, LDLCALC, TRIG, CHOLHDL, LDLDIRECT in the last 72 hours. Thyroid Function Tests: No results for input(s): TSH, T4TOTAL, FREET4, T3FREE, THYROIDAB in the last 72 hours. Anemia Panel: No results for input(s): VITAMINB12, FOLATE, FERRITIN, TIBC, IRON, RETICCTPCT in the last 72 hours. Urine analysis:    Component Value Date/Time   COLORURINE AMBER (A) 05/29/2019 1643   APPEARANCEUR CLEAR 05/29/2019 1643   LABSPEC 1.023 05/29/2019 1643   PHURINE 7.0 05/29/2019 1643   GLUCOSEU NEGATIVE 05/29/2019 1643   HGBUR NEGATIVE 05/29/2019 1643   BILIRUBINUR NEGATIVE 05/29/2019 1643   KETONESUR NEGATIVE 05/29/2019 1643   PROTEINUR 100 (A) 05/29/2019 1643   NITRITE NEGATIVE 05/29/2019 1643   LEUKOCYTESUR NEGATIVE 05/29/2019 1643     Chrisandra Wiemers M.D. [facility] 05/31/2019, 11:52 AM   Call night coverage person covering after 7pm

## 2019-06-01 ENCOUNTER — Inpatient Hospital Stay (HOSPITAL_COMMUNITY): Payer: Self-pay

## 2019-06-01 DIAGNOSIS — Z20822 Contact with and (suspected) exposure to covid-19: Secondary | ICD-10-CM

## 2019-06-01 LAB — CBC
HCT: 34.7 % — ABNORMAL LOW (ref 39.0–52.0)
Hemoglobin: 11.5 g/dL — ABNORMAL LOW (ref 13.0–17.0)
MCH: 31.1 pg (ref 26.0–34.0)
MCHC: 33.1 g/dL (ref 30.0–36.0)
MCV: 93.8 fL (ref 80.0–100.0)
Platelets: 364 10*3/uL (ref 150–400)
RBC: 3.7 MIL/uL — ABNORMAL LOW (ref 4.22–5.81)
RDW: 13 % (ref 11.5–15.5)
WBC: 6.6 10*3/uL (ref 4.0–10.5)
nRBC: 0 % (ref 0.0–0.2)

## 2019-06-01 LAB — BASIC METABOLIC PANEL
Anion gap: 8 (ref 5–15)
BUN: 13 mg/dL (ref 6–20)
CO2: 24 mmol/L (ref 22–32)
Calcium: 7.9 mg/dL — ABNORMAL LOW (ref 8.9–10.3)
Chloride: 107 mmol/L (ref 98–111)
Creatinine, Ser: 0.69 mg/dL (ref 0.61–1.24)
GFR calc Af Amer: >60 mL/min (ref >60)
GFR calc non Af Amer: >60 mL/min (ref >60)
Glucose, Bld: 96 mg/dL (ref 70–99)
Potassium: 4.1 mmol/L (ref 3.5–5.1)
Sodium: 139 mmol/L (ref 135–145)

## 2019-06-01 LAB — D-DIMER, QUANTITATIVE: D-Dimer, Quant: 2.76 ug/mL-FEU — ABNORMAL HIGH (ref 0.00–0.50)

## 2019-06-01 LAB — C-REACTIVE PROTEIN: CRP: 9.3 mg/dL — ABNORMAL HIGH (ref <1.0)

## 2019-06-01 LAB — PROCALCITONIN: Procalcitonin: <0.1 ng/mL

## 2019-06-01 MED ORDER — ASPIRIN 81 MG PO TBEC: 81.0000 mg | DELAYED_RELEASE_TABLET | Freq: Every day | ORAL | 1 refills | Status: DC

## 2019-06-01 MED ORDER — DEXAMETHASONE 4 MG PO TABS
6.0000 mg | ORAL_TABLET | Freq: Every day | ORAL | Status: DC
  Administered 2019-06-01: 6 mg via ORAL
  Filled 2019-06-01: qty 2

## 2019-06-01 MED ORDER — IOHEXOL 350 MG/ML SOLN
100.0000 mL | Freq: Once | INTRAVENOUS | Status: AC | PRN
  Administered 2019-06-01: 100 mL via INTRAVENOUS

## 2019-06-01 MED ORDER — CEFPODOXIME PROXETIL 200 MG PO TABS: 200.0000 mg | ORAL_TABLET | Freq: Two times a day (BID) | ORAL | 0 refills | Status: AC

## 2019-06-01 MED ORDER — DEXAMETHASONE 6 MG PO TABS: 6.0000 mg | ORAL_TABLET | Freq: Every day | ORAL | 0 refills | Status: AC

## 2019-06-01 MED ORDER — BENZONATATE 100 MG PO CAPS: 100.0000 mg | ORAL_CAPSULE | Freq: Three times a day (TID) | ORAL | 0 refills | Status: AC | PRN

## 2019-06-01 MED ORDER — PANTOPRAZOLE SODIUM 20 MG PO TBEC
20.0000 mg | DELAYED_RELEASE_TABLET | Freq: Every day | ORAL | 0 refills | Status: DC
Start: 2019-06-01 — End: 2019-06-26

## 2019-06-01 MED ORDER — SODIUM CHLORIDE (PF) 0.9 % IJ SOLN
INTRAMUSCULAR | Status: AC
  Filled 2019-06-01: qty 50

## 2019-06-01 MED ORDER — DOXYCYCLINE HYCLATE 100 MG PO TABS: 100.0000 mg | ORAL_TABLET | Freq: Two times a day (BID) | ORAL | 0 refills | Status: AC

## 2019-06-01 MED ORDER — ALBUTEROL SULFATE HFA 108 (90 BASE) MCG/ACT IN AERS
2.0000 | INHALATION_SPRAY | Freq: Four times a day (QID) | RESPIRATORY_TRACT | 2 refills | Status: DC | PRN
Start: 2019-06-01 — End: 2020-07-22

## 2019-06-01 MED ORDER — ASPIRIN EC 81 MG PO TBEC
81.0000 mg | DELAYED_RELEASE_TABLET | Freq: Every day | ORAL | Status: DC
  Administered 2019-06-01: 81 mg via ORAL
  Filled 2019-06-01: qty 1

## 2019-06-01 NOTE — Progress Notes (Signed)
SATURATION QUALIFICATIONS: (This note is used to comply with regulatory documentation for home oxygen)  Patient Saturations on Room Air at Rest = 90%  Patient Saturations on Room Air while Ambulating = 88%  Patient Saturations on 2 Liters of oxygen while Ambulating = 93%  Please briefly explain why patient needs home oxygen: to maintain O2 sats 92% and above

## 2019-06-01 NOTE — TOC Progression Note (Signed)
Transition of Care [facility]) - Progression Note    Patient Details  Name: Dennis Booth MRN: [id number] Date of Birth: January 15, 1966  [facility]) CM/SW Contact  Armanda Heritage, RN Phone Number: 06/01/2019, 4:58 PM  Clinical Narrative:    Home Oxygen referral given to [employer] rep Keon.   Expected Discharge Plan: Home/Self Care Barriers to Discharge: No Barriers Identified  Expected Discharge Plan and Services Expected Discharge Plan: Home/Self Care   Discharge Planning Services: CM Consult   Living arrangements for the past 2 months: Single Family Home Expected Discharge Date: 06/01/19               DME Arranged: Oxygen DME Agency: [employer] Date DME Agency Contacted: 06/01/19 Time DME Agency Contacted: [phone] Representative spoke with at DME Agency: [place]             Social Determinants of Health (SDOH) Interventions    Readmission Risk Interventions No flowsheet data found.

## 2019-06-01 NOTE — Progress Notes (Signed)
Initial Nutrition Assessment  DOCUMENTATION CODES:   Not applicable  INTERVENTION:  Continue regular diet.   Encourage adequate PO intake.   NUTRITION DIAGNOSIS:   Increased nutrient needs related to acute illness(PNA) as evidenced by estimated needs.  GOAL:   Patient will meet greater than or equal to 90% of their needs  MONITOR:   PO intake, Skin, Weight trends, Labs, I & O's  REASON FOR ASSESSMENT:   Malnutrition Screening Tool    ASSESSMENT:   54 year old male with history of prediabetes, diverticulosis, atopic dermatitis, presented with fever chills, generalized malaise, abdominal pain. Chest x-ray showed multifocal pneumonia.  RD working remotely.  Pt unavailable during attempted time of contact. Meal completion has been 100%. Continue regular diet. Unable to complete Nutrition-Focused physical exam at this time.   Labs and medications reviewed.   Diet Order:   Diet Order            Diet regular Room service appropriate? Yes; Fluid consistency: Thin  Diet effective now              EDUCATION NEEDS:   Not appropriate for education at this time  Skin:  Skin Assessment: Reviewed RN Assessment  Last BM:  4/24  Height:   Ht Readings from Last 1 Encounters:  05/30/19 5\' 4"  (1.626 m)    Weight:   Wt Readings from Last 1 Encounters:  05/30/19 78.3 kg    BMI:  Body mass index is 29.63 kg/m.  Estimated Nutritional Needs:   Kcal:  1900-2100  Protein:  90-100 grams  Fluid:  >/= 1.9 L/day   06/01/19, MS, RD, LDN RD pager number/after hours weekend pager number on Amion.

## 2019-06-01 NOTE — Discharge Summary (Signed)
Physician Discharge Summary   Patient ID: Dennis Booth MRN: [id number] DOB/AGE: [birth year] 53 y.o.  Admit date: 05/29/2019 Discharge date: 06/01/2019  Primary Care Physician:  Mack Hook, MD   Recommendations for Outpatient Follow-up:  1. Follow up with PCP in 1-2 weeks 2. Patient had COVID-19 test negative x3 however his x-ray findings shows multifocal pneumonia, has hypoxia, still suspicion remains for COVID-19 3. Patient was treated for multifocal pneumonia, discharging on Vantin 200 mg twice daily, doxycycline 100 mg twice daily for 5 more days, added Decadron 6 mg daily.  Patient strongly recommended to return to ER if worsening symptoms of chest pain, fever chills shortness of breath or hypoxia.  He was recommended portable pulse ox measurement frequently, explained to the daughter.  Home Health: None Equipment/Devices: DME home O2 2 L  SATURATION QUALIFICATIONS: (This note is used to comply with regulatory documentation for home oxygen)  Patient Saturations on Room Air at Rest = 90%  Patient Saturations on Room Air while Ambulating = 88%  Patient Saturations on 2 Liters of oxygen while Ambulating = 93%  Please briefly explain why patient needs home oxygen: to maintain O2 sats 92% and above   Discharge Condition: stable CODE STATUS: FULL or DNR   Diet recommendation:    Discharge Diagnoses:      Acute respiratory failure with hypoxia . Multifocal CAP (community acquired pneumonia) (suspected COVID 26) . Prediabetes . Hypokalemia    Consults: None    Allergies:   Allergies  Allergen Reactions  . Fruit & Vegetable Daily [Nutritional Supplements] Other (See Comments)    States when he eats any type of fruit and takes Tylenol at same time, gets irritation in his throat--drinks milk and gets better, but was prescribed Epipen for this in past--not clear why.     DISCHARGE MEDICATIONS: Allergies as of 06/01/2019      Reactions   Fruit &  Vegetable Daily [nutritional Supplements] Other (See Comments)   States when he eats any type of fruit and takes Tylenol at same time, gets irritation in his throat--drinks milk and gets better, but was prescribed Epipen for this in past--not clear why.      Medication List    TAKE these medications   albuterol 108 (90 Base) MCG/ACT inhaler Commonly known as: VENTOLIN HFA Inhale 2 puffs into the lungs every 6 (six) hours as needed for wheezing or shortness of breath.   aspirin 81 MG EC tablet Take 1 tablet (81 mg total) by mouth daily.   benzonatate 100 MG capsule Commonly known as: Tessalon Perles Take 1 capsule (100 mg total) by mouth 3 (three) times daily as needed for cough.   brompheniramine-pseudoephedrine-DM 30-2-10 MG/5ML syrup Take 10 mLs by mouth every 6 (six) hours as needed (cold symptoms).   cefpodoxime 200 MG tablet Commonly known as: VANTIN Take 1 tablet (200 mg total) by mouth 2 (two) times daily for 5 days.   dexamethasone 6 MG tablet Commonly known as: DECADRON Take 1 tablet (6 mg total) by mouth daily for 9 days. Start taking on: June 02, 2019   doxycycline 100 MG tablet Commonly known as: VIBRA-TABS Take 1 tablet (100 mg total) by mouth 2 (two) times daily for 5 days.   pantoprazole 20 MG tablet Commonly known as: Protonix Take 1 tablet (20 mg total) by mouth daily for 14 days.            Durable Medical Equipment  (From admission, onward)  Start     Ordered   06/01/19 1229  For home use only DME oxygen  Once    Question Answer Comment  Length of Need 6 Months   Mode or (Route) Nasal cannula   Liters per Minute 2   Frequency Continuous (stationary and portable oxygen unit needed)   Oxygen conserving device Yes   Oxygen delivery system Gas      06/01/19 1228           Brief H and P: For complete details please refer to admission H and P, but in brief *Patient is a 54 year old male with history of prediabetes, diverticulosis,  atopic dermatitis, presented with fever chills, generalized malaise, abdominal pain for 3 days prior to admission.  Patient was noticed to have a temp of 102.3 F, shortness of breath, tachypnea, chills in ED.  O2 sats 93% on 2 L Chest x-ray showed multifocal pneumonia COVID-19 t x2 during this hospitalization negative Reports he had his first COVID test at [facility], per patient he received a call from them on 4/23 and it was negative   Hospital Course:  Acute respiratory failure with hypoxia secondary to multifocal CAP (community acquired pneumonia) - COVID-19 test twice negative inpatient on 4/22 and 4/23. Per patient outpatient test at the clinic on 4/21 also was negative.  Urine strep antigen negative,  influenza negative.   -Chest x-ray showed multifocal pneumonia -At baseline not on any O2, O2 sats 94%, initially on 5L, now weaned to 2L at discharge  -Patient was placed on IV Rocephin, Zithromax, blood cultures negative. He was transitioned to oral doxycycline and vantin for discharge  -Home O2 evaluation done, qualifies for 2L O2 - BNP slightly elevated 196, received lasix 20mg  Iv x1 - Given suspicion for Covid, baseline CRP, d dimer, procal obtained. elevtaed d Dimer, 2.76, patient declined short term eliquis. CTA chest was negative for PE but shows multifocal pneumonia.  - started on decadron 6mg  daily for total 10days, with PPI, ASA 81mg  daily - Explained to patient and daughter, to return back to ER if he develops worsening shortness of breath, hypoxia or fevers. Will get portable pulse ox from the pharmacy.        Prediabetes -Hemoglobin A1c 5.6    Hypokalemia -Resolved   Day of Discharge S: Feeling better, wants to go home. No worsening dyspnea, fevers   BP 102/68 (BP Location: Left Arm)   Pulse 64   Temp 98.1 F (36.7 C) (Oral)   Resp (!) 22   Ht 5\' 4"  (1.626 m)   Wt 78.3 kg   SpO2 95%   BMI 29.63 kg/m   Physical Exam: General: Alert and awake oriented x3 not  in any acute distress. HEENT: anicteric sclera, pupils reactive to light and accommodation CVS: S1-S2 clear no murmur rubs or gallops Chest: decreased BS at bases  Abdomen: soft nontender, nondistended, normal bowel sounds Extremities: no cyanosis, clubbing or edema noted bilaterally Neuro: Cranial nerves II-XII intact, no focal neurological deficits    Get Medicines reviewed and adjusted: Please take all your medications with you for your next visit with your Primary MD  Please request your Primary MD to go over all hospital tests and procedure/radiological results at the follow up. Please ask your Primary MD to get all Hospital records sent to his/her office.  If you experience worsening of your admission symptoms, develop shortness of breath, life threatening emergency, suicidal or homicidal thoughts you must seek medical attention immediately by calling 911 or  calling your MD immediately  if symptoms less severe.  You must read complete instructions/literature along with all the possible adverse reactions/side effects for all the Medicines you take and that have been prescribed to you. Take any new Medicines after you have completely understood and accept all the possible adverse reactions/side effects.   Do not drive when taking pain medications.   Do not take more than prescribed Pain, Sleep and Anxiety Medications  Special Instructions: If you have smoked or chewed Tobacco  in the last 2 yrs please stop smoking, stop any regular Alcohol  and or any Recreational drug use.  Wear Seat belts while driving.  Please note  You were cared for by a hospitalist during your hospital stay. Once you are discharged, your primary care physician will handle any further medical issues. Please note that NO REFILLS for any discharge medications will be authorized once you are discharged, as it is imperative that you return to your primary care physician (or establish a relationship with a primary  care physician if you do not have one) for your aftercare needs so that they can reassess your need for medications and monitor your lab values.   The results of significant diagnostics from this hospitalization (including imaging, microbiology, ancillary and laboratory) are listed below for reference.      Procedures/Studies:  CT ANGIO CHEST PE W OR WO CONTRAST  Result Date: 06/01/2019 CLINICAL DATA:  54 year old male with shortness of breath and abnormal D-dimer. Reportedly negative for COVID-19 on 05/30/2019. EXAM: CT ANGIOGRAPHY CHEST WITH CONTRAST TECHNIQUE: Multidetector CT imaging of the chest was performed using the standard protocol during bolus administration of intravenous contrast. Multiplanar CT image reconstructions and MIPs were obtained to evaluate the vascular anatomy. CONTRAST:  OMNIPAQUE IOHEXOL 350 MG/ML SOLN COMPARISON:  Portable chest 05/29/2019. FINDINGS: Cardiovascular: Good contrast bolus timing in the pulmonary arterial tree. Respiratory motion. No central or hilar pulmonary artery filling defect. The bilateral segmental pulmonary arteries also appear to be normal. But distal branch detail is degraded by motion. Cardiac size at the upper limits of normal. No pericardial effusion. No calcified coronary artery atherosclerosis is evident. Little contrast in the aorta. Mediastinum/Nodes: Borderline to mild reactive appearing mediastinal and hilar lymph nodes. Possible small gastric hiatal hernia. Lungs/Pleura: Major airways remain patent. There is widespread and fairly symmetric bilateral peribronchial and peripheral pulmonary opacity ranging from ground-glass to early consolidation with air bronchograms. The lower lobe superior segments are among the worst affected. No superimposed pleural effusion. Upper Abdomen: Negative visible liver, gallbladder, spleen, pancreas, adrenal glands, kidneys and bowel in the upper abdomen. Musculoskeletal: No acute osseous abnormality  identified. Review of the MIP images confirms the above findings. IMPRESSION: 1. Positive for widespread bilateral pneumonia with a viral/atypical etiology favored. No pleural effusion. Assuming negative for COVID, other differential considerations include mycoplasma, PCP, also endemic fungi. 2. No acute pulmonary embolus identified, although detail of subsegmental and distal branches is suboptimal. 3. Borderline to mild reactive appearing mediastinal and hilar lymph nodes. Electronically Signed   By: Odessa Fleming M.D.   On: 06/01/2019 15:15   DG Chest Portable 1 View  Result Date: 05/29/2019 CLINICAL DATA:  Shortness of breath, fever, never smoker EXAM: PORTABLE CHEST 1 VIEW COMPARISON:  None FINDINGS: Diffuse multifocal areas of airspace consolidation throughout both lungs. No pneumothorax or effusion. The cardiomediastinal contours are unremarkable. No acute osseous or soft tissue abnormality. Degenerative changes are present in the imaged spine and shoulders. Telemetry leads overlie the chest. IMPRESSION:  Features concerning for a multifocal pneumonia including potential COVID-19 etiology. Electronically Signed   By: Kreg Shropshire M.D.   On: 05/29/2019 18:56      LAB RESULTS: Basic Metabolic Panel: Recent Labs  Lab 05/30/19 1517 06/01/19 0452  NA 141 139  K 3.9 4.1  CL 108 107  CO2 25 24  GLUCOSE 167* 96  BUN 18 13  CREATININE 0.66 0.69  CALCIUM 8.1* 7.9*  MG 2.3  --   PHOS 3.0  --    Liver Function Tests: Recent Labs  Lab 05/29/19 1643 05/30/19 1517  AST 66*  --   ALT 98*  --   ALKPHOS 67  --   BILITOT 1.1  --   PROT 7.0  --   ALBUMIN 2.8* 2.5*   Recent Labs  Lab 05/29/19 1643  LIPASE 39   No results for input(s): AMMONIA in the last 168 hours. CBC: Recent Labs  Lab 05/30/19 0130 05/30/19 0130 06/01/19 0452  WBC 6.0  --  6.6  HGB 12.4*  --  11.5*  HCT 37.7*  --  34.7*  MCV 92.4   < > 93.8  PLT 261  --  364   < > = values in this interval not displayed.    Cardiac Enzymes: No results for input(s): CKTOTAL, CKMB, CKMBINDEX, TROPONINI in the last 168 hours. BNP: Invalid input(s): POCBNP CBG: No results for input(s): GLUCAP in the last 168 hours.     Disposition and Follow-up: Discharge Instructions    Diet - low sodium heart healthy   Complete by: As directed    Discharge instructions   Complete by: As directed    Please check your pulse ox every few hours at home.  Please return back to the ER if worsening symptoms including shortness of breath or oxygen levels, fevers, chills   Increase activity slowly   Complete by: As directed    Temperature monitoring   Complete by: Jun 01, 2019    After how many days would you like to receive a notification of this patient's flowsheet entries?: 1   MyChart COVID-19 home monitoring program   Complete by: Jun 15, 2019    Is the patient willing to use the MyChart Mobile App for home monitoring?: Yes       DISPOSITION: home    DISCHARGE FOLLOW-UP Follow-up Information    Julieanne Manson, MD. Schedule an appointment as soon as possible for a visit in 10 day(s).   Specialty: Internal Medicine Contact information: [address] [place] [address] [phone]            Time coordinating discharge:    Signed:   Thad Ranger M.D. [facility] 06/01/2019, 3:56 PM

## 2019-06-03 LAB — LEGIONELLA PNEUMOPHILA SEROGP 1 UR AG: L. pneumophila Serogp 1 Ur Ag: NEGATIVE

## 2019-06-03 LAB — CULTURE, BLOOD (ROUTINE X 2)
Culture: NO GROWTH
Culture: NO GROWTH
Special Requests: ADEQUATE
Special Requests: ADEQUATE

## 2019-06-04 LAB — CULTURE, BLOOD (ROUTINE X 2)
Culture: NO GROWTH
Special Requests: ADEQUATE

## 2019-06-26 ENCOUNTER — Ambulatory Visit (INDEPENDENT_AMBULATORY_CARE_PROVIDER_SITE_OTHER): Payer: Self-pay | Admitting: Internal Medicine

## 2019-06-26 ENCOUNTER — Other Ambulatory Visit: Payer: Self-pay

## 2019-06-26 ENCOUNTER — Encounter: Payer: Self-pay | Admitting: Internal Medicine

## 2019-06-26 VITALS — BP 116/68 | HR 66 | Resp 12 | Ht 63.0 in | Wt 167.0 lb

## 2019-06-26 DIAGNOSIS — D649 Anemia, unspecified: Secondary | ICD-10-CM

## 2019-06-26 DIAGNOSIS — R739 Hyperglycemia, unspecified: Secondary | ICD-10-CM

## 2019-06-26 DIAGNOSIS — R7401 Elevation of levels of liver transaminase levels: Secondary | ICD-10-CM

## 2019-06-26 DIAGNOSIS — J189 Pneumonia, unspecified organism: Secondary | ICD-10-CM

## 2019-06-26 NOTE — Progress Notes (Signed)
    Subjective:    Patient ID: Dennis Booth, male   DOB: 04/14/1965, 54 y.o.   MRN: [id number]   HPI   Hospitalized 4/22 through 06/01/19 for community acquired pneumonia.  CXR findings with mulifocal pneumonia and hypoxia, elevated D dimer was consistent with COVID 19, but testing x3 was negative for COVID19.  CT of chest negative for PE. Treated in house with Rocephin,  Zithromax, Decadron and transitioned to oral Vantin and Doxycycline, oral Decadron (6 mg for total of 10 days) at discharge to complete course of antibiotics. Did have home O2 for 4 days after discharge.  Took about 2 weeks for him to feel as if he was baseline with health.  He also had a mild elevation of blood glucose with history of prediabetes, though A1C had normalized in December.  Not sure if elevated glucose was before or after Decadron given.  Transaminases elevated as well.  His breathing and energy are back to normal.  No complaints.  He has an appt to get COVID vaccine for Saturday.  Actually, planning to walk in.    No outpatient medications have been marked as taking for the 06/26/19 encounter (Office Visit) with Julieanne Manson, MD.   Allergies  Allergen Reactions  . Fruit & Vegetable Daily [Nutritional Supplements] Other (See Comments)    States when he eats any type of fruit and takes Tylenol at same time, gets irritation in his throat--drinks milk and gets better, but was prescribed Epipen for this in past--not clear why.     Review of Systems    Objective:   BP 116/68 (BP Location: Left Arm, Patient Position: Sitting, Cuff Size: Normal)   Pulse 66   Resp 12   Ht 5\' 3"  (1.6 m)   Wt 167 lb (75.8 kg)   BMI 29.58 kg/m   Physical Exam  NAD HEENT:  PERRL, EOMI Neck:  Supple, No adenopathy Chest:  CTA CV:  RRR without murmur or rub.  Radial and DP pulses normal and equal Abd:  S, NT, No HSM or mass, + BS LE:  No edema   Assessment & Plan   1.  CAP, multifocal and consistent  with COVID19, though testing for COVID negative.  He is obtaining COVID vaccination this Saturday with his family.  Discussed if unable to do so, can come to our clinic on Monday. Looks well.  2.  Hyperglycemia, elevated transaminases, mild anemia during hospitalization.  A1C was normal in December.  Check CMP, CBC today.  Cancel appt in 2 weeks for A1C and follow up.

## 2019-06-27 LAB — COMPREHENSIVE METABOLIC PANEL
ALT: 15 IU/L (ref 0–44)
AST: 10 IU/L (ref 0–40)
Albumin/Globulin Ratio: 1.4 (ref 1.2–2.2)
Albumin: 3.7 g/dL — ABNORMAL LOW (ref 3.8–4.9)
Alkaline Phosphatase: 89 IU/L (ref 48–121)
BUN/Creatinine Ratio: 15 (ref 9–20)
BUN: 9 mg/dL (ref 6–24)
Bilirubin Total: 0.3 mg/dL (ref 0.0–1.2)
CO2: 20 mmol/L (ref 20–29)
Calcium: 8.7 mg/dL (ref 8.7–10.2)
Chloride: 108 mmol/L — ABNORMAL HIGH (ref 96–106)
Creatinine, Ser: 0.59 mg/dL — ABNORMAL LOW (ref 0.76–1.27)
GFR calc Af Amer: 135 mL/min/{1.73_m2} (ref >59)
GFR calc non Af Amer: 116 mL/min/{1.73_m2} (ref >59)
Globulin, Total: 2.7 g/dL (ref 1.5–4.5)
Glucose: 91 mg/dL (ref 65–99)
Potassium: 3.9 mmol/L (ref 3.5–5.2)
Sodium: 141 mmol/L (ref 134–144)
Total Protein: 6.4 g/dL (ref 6.0–8.5)

## 2019-06-27 LAB — CBC WITH DIFFERENTIAL/PLATELET
Basophils Absolute: 0 10*3/uL (ref 0.0–0.2)
Basos: 1 %
EOS (ABSOLUTE): 0.1 10*3/uL (ref 0.0–0.4)
Eos: 3 %
Hematocrit: 38.4 % (ref 37.5–51.0)
Hemoglobin: 12.6 g/dL — ABNORMAL LOW (ref 13.0–17.7)
Immature Grans (Abs): 0 10*3/uL (ref 0.0–0.1)
Immature Granulocytes: 1 %
Lymphocytes Absolute: 1.6 10*3/uL (ref 0.7–3.1)
Lymphs: 34 %
MCH: 30.7 pg (ref 26.6–33.0)
MCHC: 32.8 g/dL (ref 31.5–35.7)
MCV: 94 fL (ref 79–97)
Monocytes Absolute: 0.4 10*3/uL (ref 0.1–0.9)
Monocytes: 9 %
Neutrophils Absolute: 2.5 10*3/uL (ref 1.4–7.0)
Neutrophils: 52 %
Platelets: 174 10*3/uL (ref 150–450)
RBC: 4.1 x10E6/uL — ABNORMAL LOW (ref 4.14–5.80)
RDW: 14.5 % (ref 11.6–15.4)
WBC: 4.7 10*3/uL (ref 3.4–10.8)

## 2019-07-14 ENCOUNTER — Other Ambulatory Visit: Payer: Self-pay

## 2019-07-17 ENCOUNTER — Encounter: Payer: Self-pay | Admitting: Internal Medicine

## 2019-08-07 ENCOUNTER — Other Ambulatory Visit (INDEPENDENT_AMBULATORY_CARE_PROVIDER_SITE_OTHER): Payer: Self-pay

## 2019-08-07 ENCOUNTER — Other Ambulatory Visit: Payer: Self-pay

## 2019-08-07 DIAGNOSIS — R7303 Prediabetes: Secondary | ICD-10-CM

## 2019-08-08 LAB — HGB A1C W/O EAG: Hgb A1c MFr Bld: 5.5 % (ref 4.8–5.6)

## 2020-01-22 ENCOUNTER — Ambulatory Visit: Payer: Self-pay | Admitting: Internal Medicine

## 2020-01-22 ENCOUNTER — Encounter: Payer: Self-pay | Admitting: Internal Medicine

## 2020-01-22 ENCOUNTER — Other Ambulatory Visit: Payer: Self-pay

## 2020-01-22 VITALS — BP 124/73 | HR 53 | Resp 12 | Ht 63.0 in | Wt 176.0 lb

## 2020-01-22 DIAGNOSIS — R7303 Prediabetes: Secondary | ICD-10-CM

## 2020-01-22 DIAGNOSIS — E669 Obesity, unspecified: Secondary | ICD-10-CM | POA: Insufficient documentation

## 2020-01-22 DIAGNOSIS — Z1159 Encounter for screening for other viral diseases: Secondary | ICD-10-CM

## 2020-01-22 DIAGNOSIS — D649 Anemia, unspecified: Secondary | ICD-10-CM

## 2020-01-22 DIAGNOSIS — Z0000 Encounter for general adult medical examination without abnormal findings: Secondary | ICD-10-CM

## 2020-01-22 DIAGNOSIS — Z23 Encounter for immunization: Secondary | ICD-10-CM

## 2020-01-22 DIAGNOSIS — E010 Iodine-deficiency related diffuse (endemic) goiter: Secondary | ICD-10-CM

## 2020-01-22 MED ORDER — LORATADINE 10 MG PO TABS: 10.0000 mg | ORAL_TABLET | Freq: Every day | ORAL | 11 refills | Status: DC

## 2020-01-22 MED ORDER — SILDENAFIL CITRATE 50 MG PO TABS
ORAL_TABLET | ORAL | 4 refills | Status: DC
Start: 2020-01-22 — End: 2020-07-22

## 2020-01-22 NOTE — Progress Notes (Signed)
Subjective:    Patient ID: Dennis Booth, male   DOB: Nov 15, 1965, 54 y.o.   MRN: [id number]   HPI   Interpreted by Liz Beach  Here for Male CPE:  1.  STE:  Perform monthly.  No changes.  No family history of testicular cancer.  2.  PSA:  Last checked 01/01/2018 and normal.  No family history of testicular cancer.   3.  Guaiac Cards:  Performed in past 2017 and negative.  4.  Colonoscopy: 11/27/2017 with Dr. Evette Cristal, [facility].  Internal hemorrhoids and diverticulosis.  No polyps.  Repeat in 2029  5.  Cholesterol/Glucose:  History of prediabetes, though A1C last checked in July 2021 was 5.5%.  Cholesterol panel fine 1 year ago.    6.  Immunizations:  Did not receive a flu shot this year. Immunization History  Administered Date(s) Administered   Influenza,inj,Quad PF,6+ Mos 12/14/2017   Moderna Sars-Covid-2 Vaccination 07/14/2019, 08/12/2019   Tdap 09/15/2015     No outpatient medications have been marked as taking for the 01/22/20 encounter (Office Visit) with Julieanne Manson, MD.   Allergies  Allergen Reactions   Fruit & Vegetable Daily [Nutritional Supplements] Other (See Comments)    States when he eats any type of fruit and takes Tylenol at same time, gets irritation in his throat--drinks milk and gets better, but was prescribed Epipen for this in past--not clear why.   Past Medical History:  Diagnosis Date   Atopic dermatitis    Previously followed by Dr. Amy Swaziland.  Tacrolimu 1% bid and Fluocinonide 0.05% bid, Claritin for itch   Diverticulosis 11/27/2017   Dr. Evette Cristal, GI on diagnostic colonoscopy for rectal bleeding.   Environmental and seasonal allergies    Internal hemorrhoids 11/27/2017   Dr. Evette Cristal on diagnostic colonoscopy for rectal bleeding.   Obesity (BMI 30-39.9)    Prediabetes 2013    Past Surgical History:  Procedure Laterality Date   COLONOSCOPY WITH PROPOFOL N/A 11/27/2017   Procedure: COLONOSCOPY WITH PROPOFOL;   Surgeon: Graylin Shiver, MD;  Location: [facility];  Service: Endoscopy;  Laterality: N/A;    Family History  Problem Relation Age of Onset   Diabetes Mother        associated peripheral neuropathy   Hypertension Mother    Kidney disease Mother        Diabetic CKD   Hypothyroidism Mother    Diabetes Father    Social History   Socioeconomic History   Marital status: Married    Spouse name: Philippa Sicks   Number of children: 4   Years of education: 7   Highest education level: Not on file  Occupational History   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  Tobacco Use   Smoking status: Never Smoker   Smokeless tobacco: Never Used  Vaping Use   Vaping Use: Never used  Substance and Sexual Activity   Alcohol use: Yes    Alcohol/week: 0.0 standard drinks    Comment: Beer occasionally   Drug use: No   Sexual activity: Yes    Birth control/protection: Coitus interruptus  Other Topics Concern   Not on file  Social History Narrative      [place]   Lives with his wife, Philippa Sicks and her daughter.  Vilma is also a patient at [facility].   Children still at home live with their mother here in [place].   His children are at his home  every week.   Social Determinants of Health   Financial Resource Strain: Low Risk    Difficulty of Paying Living Expenses: Not hard at all  Food Insecurity: No Food Insecurity   Worried About Programme researcher, broadcasting/film/video in the Last Year: Never true   Ran Out of Food in the Last Year: Never true  Transportation Needs: No Transportation Needs   Lack of Transportation (Medical): No   Lack of Transportation (Non-Medical): No  Physical Activity: Not on file  Stress: Not on file  Social Connections: Not on file  Intimate Partner Violence: Not At Risk   Fear of Current or Ex-Partner: No   Emotionally Abused: No   Physically Abused: No   Sexually Abused: No     Review of Systems  HENT:  Positive for sore throat (More of an itchy/scratchy throat, particularly when goes outside in the cold.  ).   Respiratory: Negative for shortness of breath.   Cardiovascular: Negative for chest pain (And physically active.), palpitations and leg swelling.       No PND or orthopnea symptoms.  Gastrointestinal: Negative for abdominal pain, blood in stool (No melena), constipation and diarrhea.  Genitourinary:       Having difficulties with obtaining an erection for past 2 months.   Describes gradual worsening with this until 2 months ago and then no function whatsoever, even with masturbation Had this happen once before and then resolved. States he and wife have a good relationship. He has desire for intercourse with her.   Neurological: Negative for weakness and numbness.  Psychiatric/Behavioral: Negative for dysphoric mood. The patient is not nervous/anxious.       Objective:   BP 124/73 (BP Location: Left Arm, Patient Position: Sitting, Cuff Size: Normal)   Pulse (!) 53   Resp 12   Ht 5\' 3"  (1.6 m)   Wt 176 lb (79.8 kg)   BMI 31.18 kg/m   Physical Exam Constitutional:      Appearance: Normal appearance.  HENT:     Head: Normocephalic and atraumatic.     Right Ear: Tympanic membrane, ear canal and external ear normal.     Left Ear: Tympanic membrane, ear canal and external ear normal.     Nose: Nose normal.     Mouth/Throat:     Mouth: Mucous membranes are moist.     Pharynx: Oropharynx is clear.  Eyes:     Extraocular Movements: Extraocular movements intact.     Pupils: Pupils are equal, round, and reactive to light.     Comments: Mild conjunctival injection bilaterally.  Neck:     Thyroid: Thyromegaly (Enlarged nodular thyroid.) present.  Cardiovascular:     Rate and Rhythm: Normal rate and regular rhythm.     Heart sounds: S1 normal and S2 normal. No murmur heard.   No friction rub. No S3 or S4 sounds.     Comments: No carotid bruits.  Carotid, radial, femoral, DP  and PT pulses normal and equal.    Pulmonary:     Effort: Pulmonary effort is normal.     Breath sounds: Normal breath sounds.  Abdominal:     General: Bowel sounds are normal.     Palpations: Abdomen is soft. There is no hepatomegaly, splenomegaly or mass.     Tenderness: There is no abdominal tenderness.     Hernia: No hernia is present. There is no hernia in the left inguinal area or right inguinal area.  Genitourinary:    Penis: Circumcised.  Testes:        Right: Mass or tenderness not present. Right testis is descended.        Left: Mass or tenderness not present. Left testis is descended.  Musculoskeletal:        General: Normal range of motion.     Cervical back: Normal range of motion and neck supple.     Right lower leg: No edema.     Left lower leg: No edema.     Comments: Heberdon's nodes on a few DIP joints  Lymphadenopathy:     Head:     Right side of head: No submental or submandibular adenopathy.     Left side of head: No submental or submandibular adenopathy.     Cervical: No cervical adenopathy.     Upper Body:     Right upper body: No supraclavicular or axillary adenopathy.     Left upper body: No supraclavicular or axillary adenopathy.     Lower Body: No right inguinal adenopathy. No left inguinal adenopathy.  Skin:    General: Skin is warm.     Capillary Refill: Capillary refill takes less than 2 seconds.  Neurological:     Mental Status: He is alert.     Cranial Nerves: Cranial nerves 2-12 are intact.     Sensory: Sensation is intact.     Motor: Motor function is intact.     Coordination: Coordination is intact.     Gait: Gait is intact.     Deep Tendon Reflexes: Reflexes are normal and symmetric.  Psychiatric:        Attention and Perception: Attention normal.        Mood and Affect: Mood normal.        Speech: Speech normal.        Behavior: Behavior normal.        Thought Content: Thought content normal.        Judgment: Judgment normal.       Assessment & Plan   CPE Guaiac cards x 3 to return in 2 weeks. Influenza vaccine CBC, CMP, Hep C, FLP  2.  Nodular thyromegaly:  Needs orange card for thyroid u/s.  TSH  3.  Prediabetes/obesity:  A1C.  Continue to work on lifestyle changes  4.  Anemia:  CBC  5.  Allergies:  Loratadine  6.  ED:  Viagra

## 2020-01-23 LAB — CBC WITH DIFFERENTIAL/PLATELET
Basophils Absolute: 0 10*3/uL (ref 0.0–0.2)
Basos: 0 %
EOS (ABSOLUTE): 0.1 10*3/uL (ref 0.0–0.4)
Eos: 2 %
Hematocrit: 44.3 % (ref 37.5–51.0)
Hemoglobin: 15 g/dL (ref 13.0–17.7)
Immature Grans (Abs): 0 10*3/uL (ref 0.0–0.1)
Immature Granulocytes: 0 %
Lymphocytes Absolute: 2 10*3/uL (ref 0.7–3.1)
Lymphs: 46 %
MCH: 31.3 pg (ref 26.6–33.0)
MCHC: 33.9 g/dL (ref 31.5–35.7)
MCV: 92 fL (ref 79–97)
Monocytes Absolute: 0.4 10*3/uL (ref 0.1–0.9)
Monocytes: 9 %
Neutrophils Absolute: 2 10*3/uL (ref 1.4–7.0)
Neutrophils: 43 %
Platelets: 214 10*3/uL (ref 150–450)
RBC: 4.8 x10E6/uL (ref 4.14–5.80)
RDW: 13 % (ref 11.6–15.4)
WBC: 4.5 10*3/uL (ref 3.4–10.8)

## 2020-01-23 LAB — COMPREHENSIVE METABOLIC PANEL
ALT: 24 IU/L (ref 0–44)
AST: 20 IU/L (ref 0–40)
Albumin/Globulin Ratio: 1.6 (ref 1.2–2.2)
Albumin: 4.5 g/dL (ref 3.8–4.9)
Alkaline Phosphatase: 95 IU/L (ref 44–121)
BUN/Creatinine Ratio: 17 (ref 9–20)
BUN: 14 mg/dL (ref 6–24)
Bilirubin Total: 0.6 mg/dL (ref 0.0–1.2)
CO2: 24 mmol/L (ref 20–29)
Calcium: 9.3 mg/dL (ref 8.7–10.2)
Chloride: 105 mmol/L (ref 96–106)
Creatinine, Ser: 0.82 mg/dL (ref 0.76–1.27)
GFR calc Af Amer: 117 mL/min/{1.73_m2} (ref >59)
GFR calc non Af Amer: 101 mL/min/{1.73_m2} (ref >59)
Globulin, Total: 2.8 g/dL (ref 1.5–4.5)
Glucose: 102 mg/dL — ABNORMAL HIGH (ref 65–99)
Potassium: 4.6 mmol/L (ref 3.5–5.2)
Sodium: 142 mmol/L (ref 134–144)
Total Protein: 7.3 g/dL (ref 6.0–8.5)

## 2020-01-23 LAB — TSH: TSH: 1.16 u[IU]/mL (ref 0.450–4.500)

## 2020-01-23 LAB — LIPID PANEL W/O CHOL/HDL RATIO
Cholesterol, Total: 171 mg/dL (ref 100–199)
HDL: 47 mg/dL (ref >39)
LDL Chol Calc (NIH): 94 mg/dL (ref 0–99)
Triglycerides: 177 mg/dL — ABNORMAL HIGH (ref 0–149)
VLDL Cholesterol Cal: 30 mg/dL (ref 5–40)

## 2020-01-23 LAB — HGB A1C W/O EAG: Hgb A1c MFr Bld: 6 % — ABNORMAL HIGH (ref 4.8–5.6)

## 2020-01-23 LAB — HEPATITIS C ANTIBODY: Hep C Virus Ab: <0.1 s/co ratio (ref 0.0–0.9)

## 2020-07-19 ENCOUNTER — Other Ambulatory Visit: Payer: Self-pay | Admitting: Internal Medicine

## 2020-07-19 ENCOUNTER — Other Ambulatory Visit: Payer: Self-pay

## 2020-07-19 DIAGNOSIS — E781 Pure hyperglyceridemia: Secondary | ICD-10-CM

## 2020-07-19 DIAGNOSIS — R7303 Prediabetes: Secondary | ICD-10-CM

## 2020-07-20 LAB — LIPID PANEL W/O CHOL/HDL RATIO
Cholesterol, Total: 171 mg/dL (ref 100–199)
HDL: 48 mg/dL (ref >39)
LDL Chol Calc (NIH): 89 mg/dL (ref 0–99)
Triglycerides: 199 mg/dL — ABNORMAL HIGH (ref 0–149)
VLDL Cholesterol Cal: 34 mg/dL (ref 5–40)

## 2020-07-20 LAB — HGB A1C W/O EAG: Hgb A1c MFr Bld: 5.6 % (ref 4.8–5.6)

## 2020-07-22 ENCOUNTER — Ambulatory Visit: Payer: Self-pay | Admitting: Internal Medicine

## 2020-07-22 ENCOUNTER — Other Ambulatory Visit: Payer: Self-pay

## 2020-07-22 ENCOUNTER — Encounter: Payer: Self-pay | Admitting: Internal Medicine

## 2020-07-22 VITALS — BP 120/78 | HR 68 | Resp 12 | Ht 63.0 in | Wt 182.0 lb

## 2020-07-22 DIAGNOSIS — Z23 Encounter for immunization: Secondary | ICD-10-CM

## 2020-07-22 DIAGNOSIS — N529 Male erectile dysfunction, unspecified: Secondary | ICD-10-CM

## 2020-07-22 DIAGNOSIS — R7303 Prediabetes: Secondary | ICD-10-CM

## 2020-07-22 DIAGNOSIS — E781 Pure hyperglyceridemia: Secondary | ICD-10-CM

## 2020-07-22 MED ORDER — TADALAFIL 10 MG PO TABS: ORAL_TABLET | ORAL | 2 refills | Status: DC

## 2020-07-22 NOTE — Progress Notes (Signed)
    Subjective:    Patient ID: Dennis Booth, male   DOB: Apr 28, 1965, 55 y.o.   MRN: [id number]   HPI  Interpreted by Richrd Sox   ED:  Prescribed Viagra with CPE in December.  Has helped a little bit.  Able to obtain erection, but does not last long.  Able to have intercourse and reach orgasm ejaculate.  Is using 100 mg as one dose now.  He would like to see if another medication worked better for him  2.  Prediabetes:  A1C now back into normal range at 5.6%.  Has decreased bread intake mainly. He has been walking daily.    3.  Elevated Triglycerides:  recent FLP about the same with slightly elevated triglycerides.  Lipid Panel     Component Value Date/Time   CHOL 171 07/19/2020 0852   TRIG 199 (H) 07/19/2020 0852   HDL 48 07/19/2020 0852   LDLCALC 89 07/19/2020 0852   LABVLDL 34 07/19/2020 0852    Current Meds  Medication Sig   loratadine (CLARITIN) 10 MG tablet Take 1 tablet (10 mg total) by mouth daily.   sildenafil (VIAGRA) 50 MG tablet 1 to 2 tabs in a 24 hour period as needed for erectile dysfunction     Allergies  Allergen Reactions   Fruit & Vegetable Daily [Nutritional Supplements] Other (See Comments)    States when he eats any type of fruit and takes Tylenol at same time, gets irritation in his throat--drinks milk and gets better, but was prescribed Epipen for this in past--not clear why.     Review of Systems      Objective:   BP 120/78 (BP Location: Right Arm, Patient Position: Sitting, Cuff Size: Normal)   Pulse 68   Resp 12   Ht 5\' 3"  (1.6 m)   Wt 182 lb (82.6 kg)   BMI 32.24 kg/m   Physical Exam NAD HEENT:  PERRL, EOMI Neck:  Supple, No adenopathy Chest:  CTA CV:  RRR without murmur or rub.  Radial and DP pulses normal and equal Abd:  S, NT, No HSM or mass, + BS LE:  No edema.   Assessment & Plan   ED:  not adequate improvement with Viagra.  Switch to Cialis 10 mg.  May increase to 20 mg if no improvement.  Went  over risks again, particularly with chest pain and notifying EMS takes Cialis.  2.  Prediabetes: much improved A1C to upper normal range with lifestyle changes.    3.  Elevated Trigs:  encouraged continued lifestyle modifications.    4.  HM:  Moderna booster.

## 2021-01-17 ENCOUNTER — Other Ambulatory Visit: Payer: Self-pay

## 2021-01-17 MED ORDER — LORATADINE 10 MG PO TABS: 10.0000 mg | ORAL_TABLET | Freq: Every day | ORAL | 6 refills | Status: DC

## 2021-01-20 ENCOUNTER — Encounter: Payer: Self-pay | Admitting: Internal Medicine

## 2021-01-21 ENCOUNTER — Other Ambulatory Visit: Payer: Self-pay

## 2021-01-26 ENCOUNTER — Encounter: Payer: Self-pay | Admitting: Internal Medicine

## 2021-04-04 ENCOUNTER — Ambulatory Visit: Payer: Self-pay | Admitting: Internal Medicine

## 2021-04-04 ENCOUNTER — Encounter: Payer: Self-pay | Admitting: Internal Medicine

## 2021-04-04 ENCOUNTER — Other Ambulatory Visit: Payer: Self-pay

## 2021-04-04 VITALS — BP 132/82 | HR 68 | Resp 16 | Ht 63.25 in | Wt 181.0 lb

## 2021-04-04 DIAGNOSIS — Z23 Encounter for immunization: Secondary | ICD-10-CM

## 2021-04-04 DIAGNOSIS — E781 Pure hyperglyceridemia: Secondary | ICD-10-CM

## 2021-04-04 DIAGNOSIS — Z0000 Encounter for general adult medical examination without abnormal findings: Secondary | ICD-10-CM

## 2021-04-04 DIAGNOSIS — R7303 Prediabetes: Secondary | ICD-10-CM

## 2021-04-04 DIAGNOSIS — Z9189 Other specified personal risk factors, not elsewhere classified: Secondary | ICD-10-CM

## 2021-04-04 NOTE — Progress Notes (Signed)
Subjective:    Patient ID: Dennis Booth, male   DOB: 1965-10-05, 56 y.o.   MRN: [id number]   HPI  Karen Kays interprets  Here for Male CPE:  1.  STE:  Does perform at least once monthly.  No family history of testicular cancer.   2.  PSA:  Last checked 12/2017 and normal.  No family history of prostate cancer.    3.  Guaiac Cards/FIT:  Last performed 2017 and negative.    4.  Colonoscopy:  Performed 11/27/2017 for rectal bleeding.  No polyps found.  + diverticulosis and internal hemorrhoids.  Has not had any further rectal bleeding.    5.  Cholesterol/Glucose:  Triglycerides mildly elevated in past 2 years.  A1C was in normal range in June of 2022 following diagnosis of prediabetes in 01/2020 with A1C up to 6.0%.  Has been able to maintain good diet and regular physical activity.   Lipid Panel     Component Value Date/Time   CHOL 171 07/19/2020 0852   TRIG 199 (H) 07/19/2020 0852   HDL 48 07/19/2020 0852   LDLCALC 89 07/19/2020 0852   LABVLDL 34 07/19/2020 0852     6.  Immunizations:  Has not had covid bivalent vaccine.  Did not come in for influenza vaccine clinic.   Has not had shingles vaccine. Immunization History  Administered Date(s) Administered   Influenza Inj Mdck Quad With Preservative 01/22/2020   Influenza,inj,Quad PF,6+ Mos 12/14/2017   Moderna SARS-COV2 Booster Vaccination 02/11/2020, 07/22/2020   Moderna Sars-Covid-2 Vaccination 07/14/2019, 08/12/2019   Tdap 09/15/2015     Current Meds  Medication Sig   loratadine (CLARITIN) 10 MG tablet Take 1 tablet (10 mg total) by mouth daily.   tadalafil (CIALIS) 10 MG tablet 1 tab by mouth at least 30 minutes before activity every other day max May increase to 2 tabs by mouth every other day as needed.   Allergies  Allergen Reactions   Fruit & Vegetable Daily [Nutritional Supplements] Other (See Comments)    States when he eats any type of fruit and takes Tylenol at same time, gets irritation in  his throat--drinks milk and gets better, but was prescribed Epipen for this in past--not clear why.   Past Medical History:  Diagnosis Date   Atopic dermatitis    Previously followed by Dr. Amy Martinique.  Tacrolimu 1% bid and Fluocinonide 0.05% bid, Claritin for itch   Diverticulosis 11/27/2017   Dr. Penelope Coop, GI on diagnostic colonoscopy for rectal bleeding.   Environmental and seasonal allergies    Internal hemorrhoids 11/27/2017   Dr. Penelope Coop on diagnostic colonoscopy for rectal bleeding.   Obesity (BMI 30-39.9)    Prediabetes 2013    Past Surgical History:  Procedure Laterality Date   COLONOSCOPY WITH PROPOFOL N/A 11/27/2017   Procedure: COLONOSCOPY WITH PROPOFOL;  Surgeon: Wonda Horner, MD;  Location: [facility];  Service: Endoscopy;  Laterality: N/A;   Family History  Problem Relation Age of Onset   Diabetes Mother        associated peripheral neuropathy   Hypertension Mother    Kidney disease Mother        Diabetic CKD   Hypothyroidism Mother    Diabetes Father    Social History   Socioeconomic History   Marital status: Married    Spouse name: Jerelene Redden   Number of children: 4   Years of education: 7   Highest education level: Not on file  Occupational History  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  Tobacco Use   Smoking status: Never    Passive exposure: Never   Smokeless tobacco: Never  Vaping Use   Vaping Use: Never used  Substance and Sexual Activity   Alcohol use: Yes    Alcohol/week: 0.0 standard drinks    Comment: Beer occasionally   Drug use: No   Sexual activity: Yes    Birth control/protection: Coitus interruptus  Other Topics Concern   Not on file  Social History Narrative      [place]   Lives with his wife, Jerelene Redden and her daughter.  Vilma is also a patient at [facility].   Children still at home live with their mother here in [place].   His children are at his home every  week.   Social Determinants of Health   Financial Resource Strain: Low Risk    Difficulty of Paying Living Expenses: Not hard at all  Food Insecurity: No Food Insecurity   Worried About Charity fundraiser in the Last Year: Never true   [place] in the Last Year: Never true  Transportation Needs: No Transportation Needs   Lack of Transportation (Medical): No   Lack of Transportation (Non-Medical): No  Physical Activity: Not on file  Stress: Not on file  Social Connections: Not on file  Intimate Partner Violence: Not At Risk   Fear of Current or Ex-Partner: No   Emotionally Abused: No   Physically Abused: No   Sexually Abused: No      Review of Systems  HENT:  Positive for dental problem (Needs a crown--too expensive.  Applying for orange card.).   Eyes:  Negative for visual disturbance (Wears corrective lenses--vision good with current prescription.  [employer].).  Respiratory:  Negative for shortness of breath.   Cardiovascular:  Negative for chest pain, palpitations and leg swelling.  Gastrointestinal:  Negative for abdominal pain and blood in stool (No melena).  Genitourinary:  Negative for difficulty urinating.  Neurological:  Negative for weakness and numbness.  Psychiatric/Behavioral:  Negative for dysphoric mood. The patient is not nervous/anxious.      Objective:   BP 132/82 (BP Location: Left Arm, Patient Position: Sitting, Cuff Size: Normal)    Pulse 68    Resp 16    Ht 5' 3.25" (1.607 m)    Wt 181 lb (82.1 kg)    BMI 31.81 kg/m   Physical Exam HENT:     Head: Normocephalic and atraumatic.     Right Ear: Tympanic membrane, ear canal and external ear normal.     Left Ear: Tympanic membrane, ear canal and external ear normal.     Nose: Nose normal.     Mouth/Throat:     Mouth: Mucous membranes are moist.     Pharynx: Oropharynx is clear.  Eyes:     Extraocular Movements: Extraocular movements intact.     Conjunctiva/sclera: Conjunctivae normal.      Pupils: Pupils are equal, round, and reactive to light.     Comments: Discs sharp.  RR + OU  Neck:     Thyroid: No thyroid mass.  Cardiovascular:     Rate and Rhythm: Normal rate and regular rhythm.     Heart sounds: S1 normal and S2 normal. No murmur heard.   No friction rub. No S3 or S4 sounds.     Comments: No carotid bruits.  Carotid, radial, femoral, DP and PT pulses  normal and equal.    Pulmonary:     Effort: Pulmonary effort is normal.     Breath sounds: Normal breath sounds.  Abdominal:     General: Bowel sounds are normal.     Palpations: Abdomen is soft. There is no hepatomegaly, splenomegaly or mass.     Tenderness: There is no abdominal tenderness.     Hernia: No hernia is present.  Genitourinary:    Penis: Normal.      Testes:        Right: Mass or tenderness not present. Right testis is descended.        Left: Mass or tenderness not present. Left testis is descended.  Musculoskeletal:        General: Normal range of motion.     Cervical back: Normal range of motion and neck supple.  Lymphadenopathy:     Head:     Right side of head: No submental or submandibular adenopathy.     Left side of head: No submental or submandibular adenopathy.     Cervical: No cervical adenopathy.     Upper Body:     Right upper body: No supraclavicular or axillary adenopathy.     Left upper body: No supraclavicular or axillary adenopathy.     Lower Body: No right inguinal adenopathy. No left inguinal adenopathy.  Skin:    General: Skin is warm.     Capillary Refill: Capillary refill takes less than 2 seconds.     Findings: No rash.  Neurological:     General: No focal deficit present.     Mental Status: He is alert and oriented to person, place, and time.     Cranial Nerves: Cranial nerves 2-12 are intact.     Sensory: Sensation is intact.     Motor: Motor function is intact.     Coordination: Coordination is intact.     Gait: Gait is intact.     Deep Tendon Reflexes: Reflexes  are normal and symmetric.  Psychiatric:        Attention and Perception: Attention normal.        Mood and Affect: Mood normal.        Behavior: Behavior normal. Behavior is cooperative.     Assessment & Plan   CPE  FIT to return in 2 weeks. Moderna Bivalent Vaccine Shingrix #1/2 Encouraged calling in each September to sign up for influenza vaccine. CBC, CMP  2.  ED:  Improved with Cialis  3.  Prediabetes/elevated Trigs:  Labs today  4.  Need for crown--broken tooth.  Referral to dental clinic.  Applying for orange card.

## 2021-04-05 LAB — COMPREHENSIVE METABOLIC PANEL
ALT: 19 IU/L (ref 0–44)
AST: 12 IU/L (ref 0–40)
Albumin/Globulin Ratio: 1.6 (ref 1.2–2.2)
Albumin: 4.3 g/dL (ref 3.8–4.9)
Alkaline Phosphatase: 97 IU/L (ref 44–121)
BUN/Creatinine Ratio: 14 (ref 9–20)
BUN: 10 mg/dL (ref 6–24)
Bilirubin Total: 0.3 mg/dL (ref 0.0–1.2)
CO2: 24 mmol/L (ref 20–29)
Calcium: 9 mg/dL (ref 8.7–10.2)
Chloride: 105 mmol/L (ref 96–106)
Creatinine, Ser: 0.69 mg/dL — ABNORMAL LOW (ref 0.76–1.27)
Globulin, Total: 2.7 g/dL (ref 1.5–4.5)
Glucose: 101 mg/dL — ABNORMAL HIGH (ref 70–99)
Potassium: 4.4 mmol/L (ref 3.5–5.2)
Sodium: 142 mmol/L (ref 134–144)
Total Protein: 7 g/dL (ref 6.0–8.5)
eGFR: 110 mL/min/{1.73_m2} (ref >59)

## 2021-04-05 LAB — CBC WITH DIFFERENTIAL/PLATELET
Basophils Absolute: 0.1 10*3/uL (ref 0.0–0.2)
Basos: 1 %
EOS (ABSOLUTE): 0.3 10*3/uL (ref 0.0–0.4)
Eos: 5 %
Hematocrit: 42.9 % (ref 37.5–51.0)
Hemoglobin: 14.3 g/dL (ref 13.0–17.7)
Immature Grans (Abs): 0 10*3/uL (ref 0.0–0.1)
Immature Granulocytes: 1 %
Lymphocytes Absolute: 1.9 10*3/uL (ref 0.7–3.1)
Lymphs: 37 %
MCH: 30.7 pg (ref 26.6–33.0)
MCHC: 33.3 g/dL (ref 31.5–35.7)
MCV: 92 fL (ref 79–97)
Monocytes Absolute: 0.3 10*3/uL (ref 0.1–0.9)
Monocytes: 7 %
Neutrophils Absolute: 2.5 10*3/uL (ref 1.4–7.0)
Neutrophils: 49 %
Platelets: 202 10*3/uL (ref 150–450)
RBC: 4.66 x10E6/uL (ref 4.14–5.80)
RDW: 13.2 % (ref 11.6–15.4)
WBC: 5 10*3/uL (ref 3.4–10.8)

## 2021-04-05 LAB — LIPID PANEL W/O CHOL/HDL RATIO
Cholesterol, Total: 155 mg/dL (ref 100–199)
HDL: 41 mg/dL (ref >39)
LDL Chol Calc (NIH): 77 mg/dL (ref 0–99)
Triglycerides: 220 mg/dL — ABNORMAL HIGH (ref 0–149)
VLDL Cholesterol Cal: 37 mg/dL (ref 5–40)

## 2021-04-05 LAB — HGB A1C W/O EAG: Hgb A1c MFr Bld: 6 % — ABNORMAL HIGH (ref 4.8–5.6)

## 2021-04-14 ENCOUNTER — Other Ambulatory Visit: Payer: Self-pay

## 2021-04-14 ENCOUNTER — Other Ambulatory Visit: Payer: Self-pay | Admitting: Internal Medicine

## 2021-04-15 ENCOUNTER — Other Ambulatory Visit (INDEPENDENT_AMBULATORY_CARE_PROVIDER_SITE_OTHER): Payer: Self-pay | Admitting: Internal Medicine

## 2021-04-15 DIAGNOSIS — Z1211 Encounter for screening for malignant neoplasm of colon: Secondary | ICD-10-CM

## 2021-04-18 LAB — POC FIT TEST STOOL: Fecal Occult Blood: NEGATIVE

## 2021-05-18 ENCOUNTER — Telehealth: Payer: Self-pay

## 2021-05-18 NOTE — Telephone Encounter (Signed)
Patient would like to get rx for Clobetasol propionate cream. Has had issues with spots of dry skin on his hands in the past and that cream helped with this issue. He recently started having this problem again and would like the cream for it. Patient goes to the [facility] on [address] ?

## 2021-05-19 MED ORDER — CLOBETASOL PROPIONATE 0.05 % EX CREA: 1.0000 "application " | TOPICAL_CREAM | Freq: Two times a day (BID) | CUTANEOUS | 0 refills | Status: DC

## 2021-05-19 NOTE — Telephone Encounter (Signed)
With Dr Delrae Alfred approval, Cream was sent to the pharmacy ?

## 2021-06-23 ENCOUNTER — Telehealth: Payer: Self-pay

## 2021-06-23 NOTE — Telephone Encounter (Signed)
Patient would like to switch clobetasol cream for clobetasol propionate cream. Believes clobetasol cream is not very effective. Has used clobetasol propionate cream in the past and believes it is more effective

## 2021-07-27 ENCOUNTER — Other Ambulatory Visit: Payer: Self-pay | Admitting: Internal Medicine

## 2021-09-12 ENCOUNTER — Other Ambulatory Visit: Payer: Self-pay

## 2021-10-03 ENCOUNTER — Ambulatory Visit: Payer: Self-pay | Admitting: Internal Medicine

## 2021-10-03 DIAGNOSIS — R7303 Prediabetes: Secondary | ICD-10-CM

## 2021-10-03 DIAGNOSIS — Z23 Encounter for immunization: Secondary | ICD-10-CM

## 2021-10-03 MED ORDER — CLOBETASOL PROPIONATE 0.05 % EX SOLN: 1.0000 | Freq: Two times a day (BID) | CUTANEOUS | 3 refills | Status: DC

## 2021-10-04 LAB — HEMOGLOBIN A1C
Est. average glucose Bld gHb Est-mCnc: 126 mg/dL
Hgb A1c MFr Bld: 6 % — ABNORMAL HIGH (ref 4.8–5.6)

## 2022-03-09 ENCOUNTER — Telehealth: Payer: Self-pay | Admitting: Internal Medicine

## 2022-03-09 NOTE — Telephone Encounter (Signed)
Wants appt to check his ears. Reports his hearing has decreased. Added to the waitlist

## 2022-03-20 NOTE — Telephone Encounter (Signed)
Patient has been scheduled.

## 2022-03-21 ENCOUNTER — Ambulatory Visit: Payer: Self-pay | Admitting: Internal Medicine

## 2022-03-21 ENCOUNTER — Encounter: Payer: Self-pay | Admitting: Internal Medicine

## 2022-03-21 VITALS — BP 122/80 | HR 80 | Resp 16 | Ht 63.25 in | Wt 183.0 lb

## 2022-03-21 DIAGNOSIS — H9313 Tinnitus, bilateral: Secondary | ICD-10-CM

## 2022-03-21 NOTE — Progress Notes (Signed)
    Subjective:    Patient ID: Dennis Booth, male   DOB: [birth year], 57 y.o.   MRN: [id number]   HPI  Dennis Booth interprets  Initially, believed he was giving history of bilateral ears with decreased hearing, but during exam, became clear he is having a low pitched tinnitus in both ears.  Has been an issue for 2 months and what was initially intermittent is now continuous.  Does not believe he has had any hearing loss..  No congestion or other cold symptoms.  He has never had issues with cerumen impaction.   He does stick Q tips in ears to clean.    Current Meds  Medication Sig   clobetasol (TEMOVATE) 0.05 % external solution Apply 1 Application topically 2 (two) times daily.   loratadine (CLARITIN) 10 MG tablet Take 1 tablet (10 mg total) by mouth daily.   tadalafil (CIALIS) 10 MG tablet TAKE 1 TABLET BY MOUTH AT LEAST 30 MINUTES BEFORE ACTIVITY EVERY OTHER DAY, MAX MAY INCREASE TO 2 TABLETS BY MOUTH EVERY OTHER DAY AS NEEDED   Allergies  Allergen Reactions   Fruit & Vegetable Daily [Nutritional Supplements] Other (See Comments)    States when he eats any type of fruit and takes Tylenol at same time, gets irritation in his throat--drinks milk and gets better, but was prescribed Epipen for this in past--not clear why.     Review of Systems    Objective:   BP 122/80 (BP Location: Left Arm, Patient Position: Sitting, Cuff Size: Normal)   Pulse 80   Resp 16   Ht 5' 3.25" (1.607 m)   Wt 183 lb (83 kg)   BMI 32.16 kg/m   Physical Exam NAD HEENT:  PERRL, EOMI, TMs are perhaps somewhat dull, but clear.  Canals clear--devoid of any cerumen.   Neck:  Supple, No adenopathy, generous thyroid Chest:  CTA CV:  RRR without murmur or rub. Radial pulses normal and equal.   Assessment & Plan   Bilateral Tinnitus:  concerned he may have some hearing loss of which he is not aware.  Would like for him to be seen by ENT to evaluate.  Referral to [facility].   Soft music in  background if noise is bothering him, particularly in quiet room.

## 2022-03-27 IMAGING — DX DG CHEST 1V PORT
1 series · 1 of 1 positions shown · non-contrast
Comparison: None

CLINICAL DATA: Shortness of breath, fever, never smoker

EXAM:
PORTABLE CHEST 1 VIEW

[chest ap]
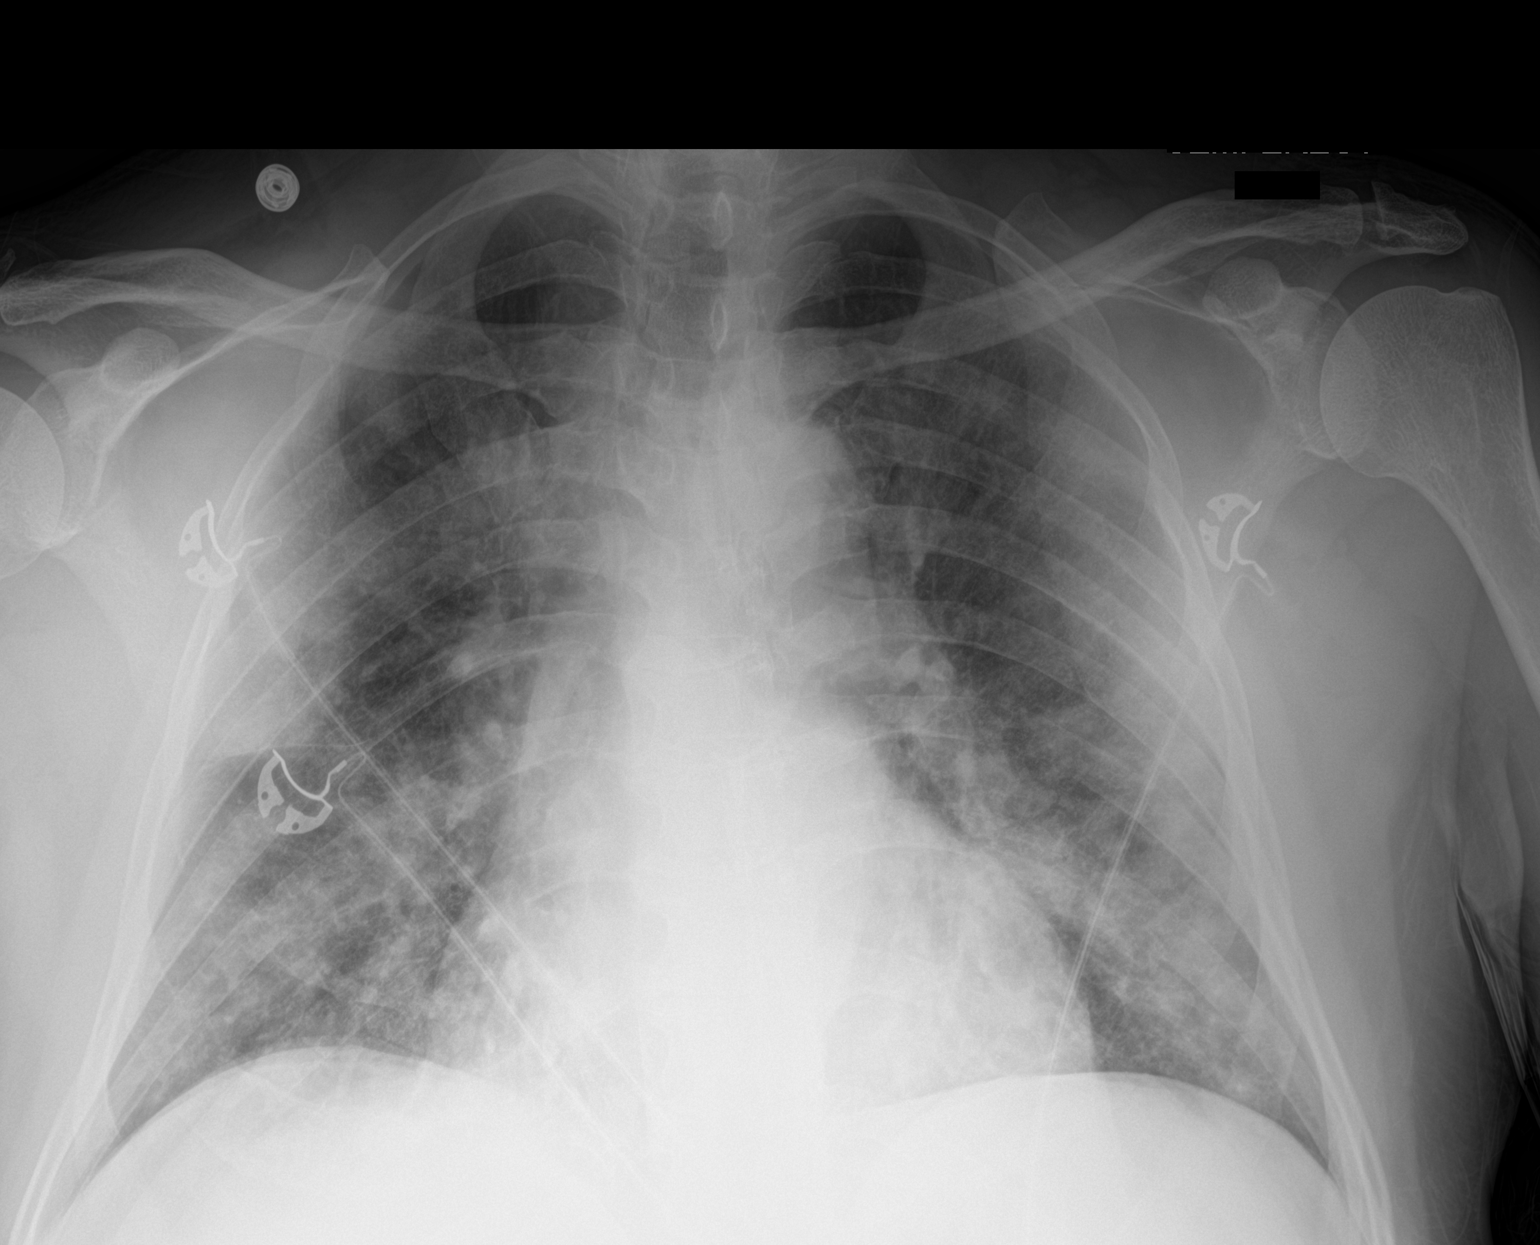

[1 of 1 positions shown; findings below may reference images not displayed]

FINDINGS: Diffuse multifocal areas of airspace consolidation throughout both
lungs. No pneumothorax or effusion. The cardiomediastinal contours
are unremarkable. No acute osseous or soft tissue abnormality.
Degenerative changes are present in the imaged spine and shoulders.
Telemetry leads overlie the chest.
IMPRESSION: Features concerning for a multifocal pneumonia including potential
[id number] etiology.

## 2022-03-30 IMAGING — CT CT ANGIO CHEST
2 of 6 series · 18 of 46 positions shown · IV contrast (APPLIED)
Comparison: Portable chest 05/29/2019.

CLINICAL DATA: 53-year-old male with shortness of breath and
abnormal D-dimer. Reportedly negative for [id number] on 05/30/2019.

EXAM:
CT ANGIOGRAPHY CHEST WITH CONTRAST
TECHNIQUE: Multidetector CT imaging of the chest was performed using the
standard protocol during bolus administration of intravenous
contrast. Multiplanar CT image reconstructions and MIPs were
obtained to evaluate the vascular anatomy.
CONTRAST:  100mL OMNIPAQUE IOHEXOL 350 MG/ML SOLN

[Series 5: thins · axial · 0.87mm/px · z∈[-302,-24]mm · 16 of 306 slices shown]
[im 14/306  lung]
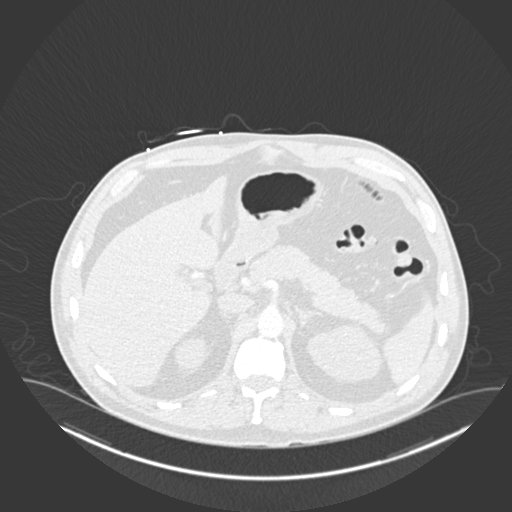
[im 40/306  soft-tissue]
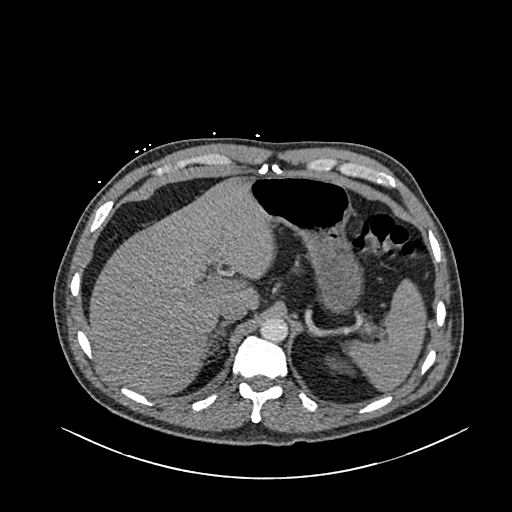
[im 54/306  lung]
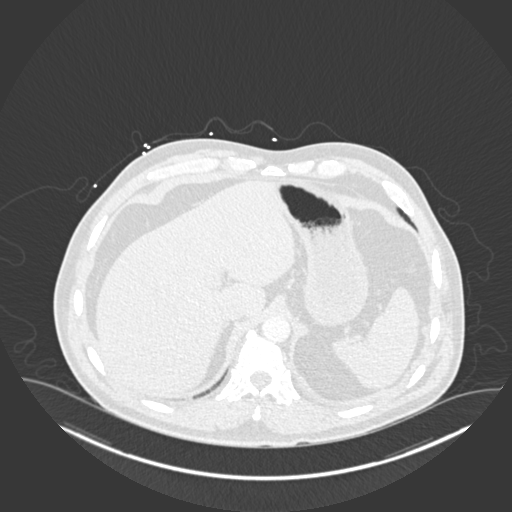
[im 67/306  soft-tissue]
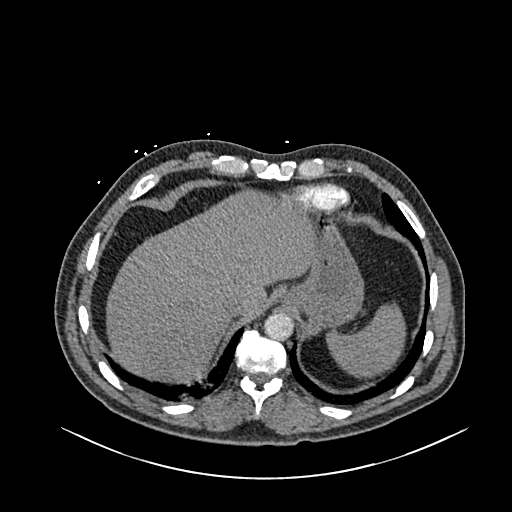
[im 93/306  lung]
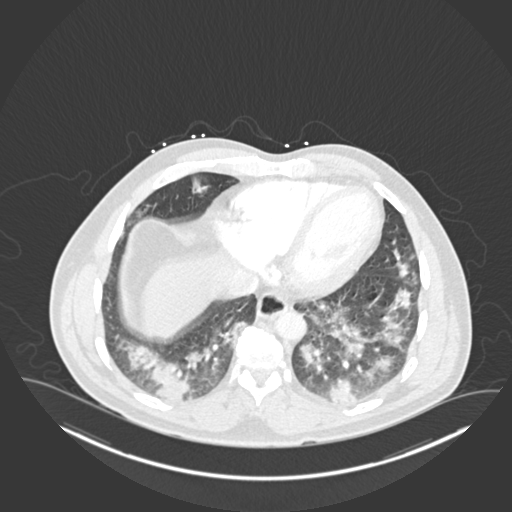
[im 107/306  soft-tissue]
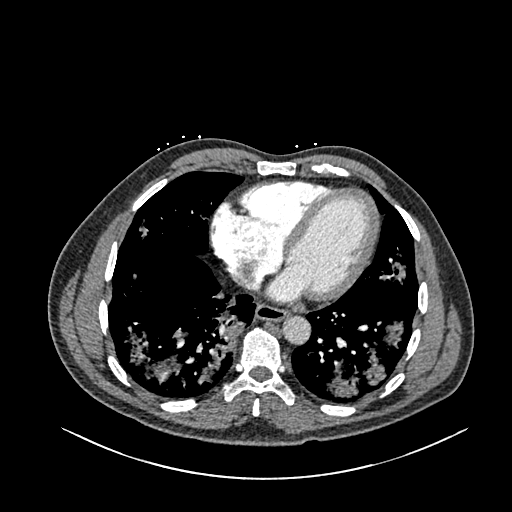
[im 120/306  lung]
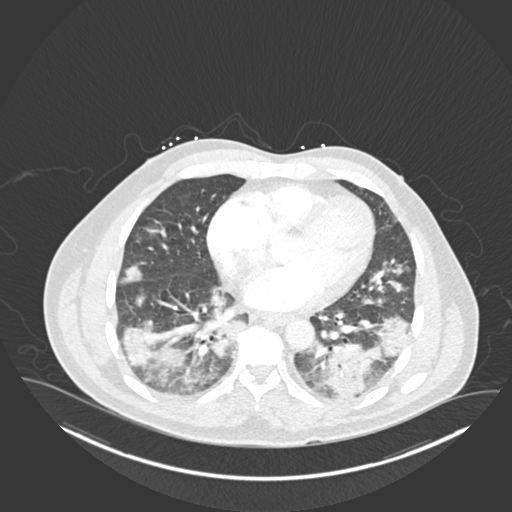
[im 146/306  soft-tissue]
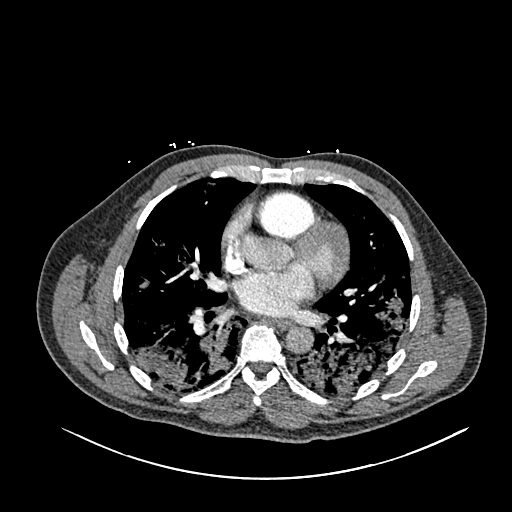
[im 160/306  lung]
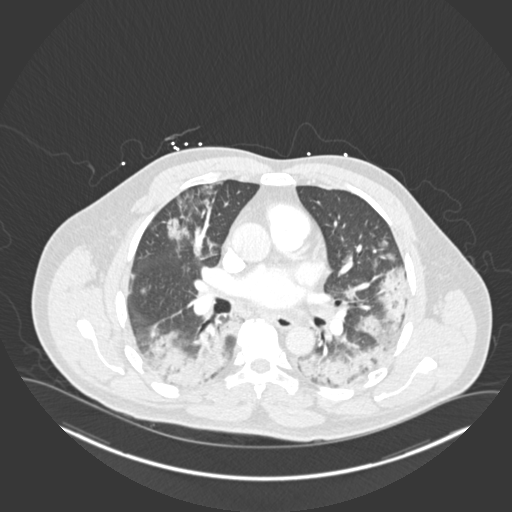
[im 186/306  soft-tissue]
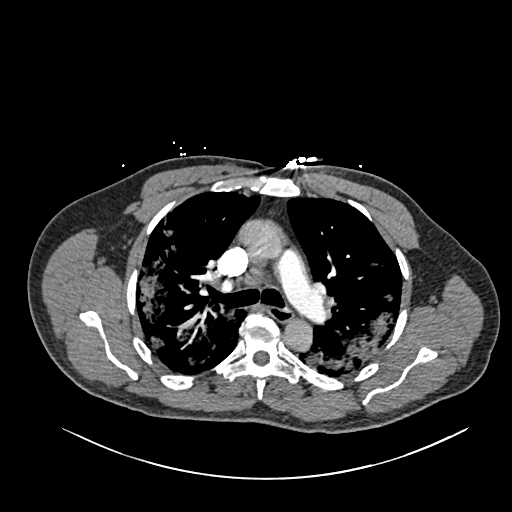
[im 199/306  lung]
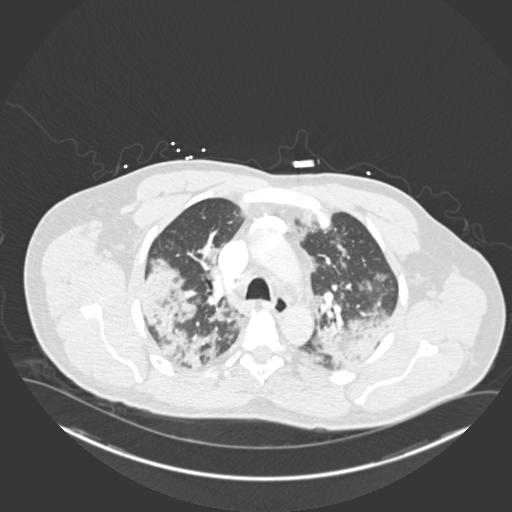
[im 213/306  soft-tissue]
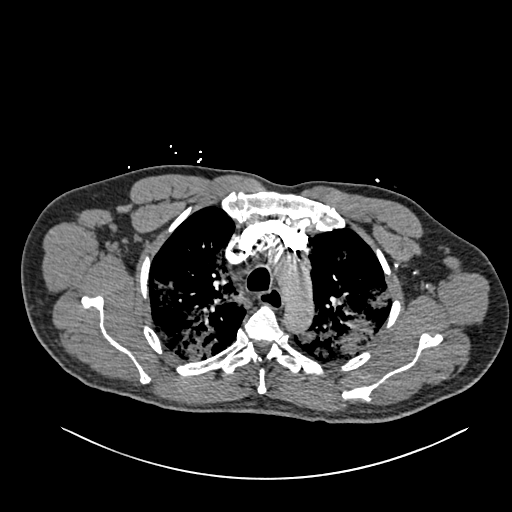
[im 239/306  lung]
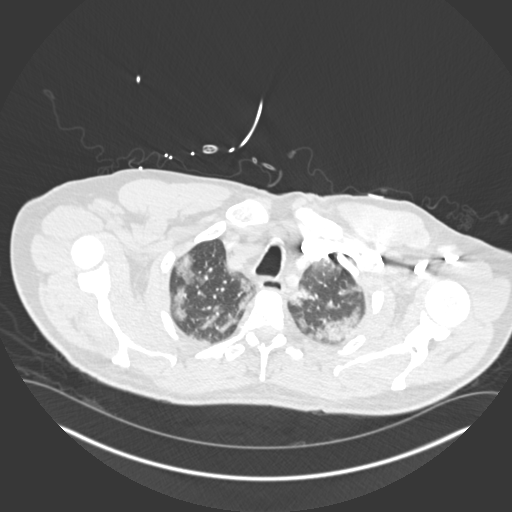
[im 252/306  soft-tissue]
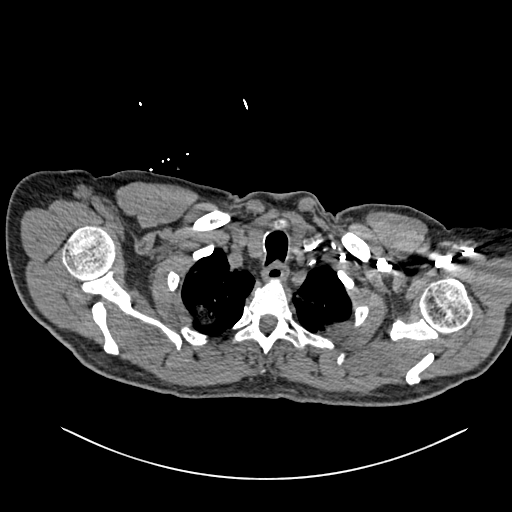
[im 266/306  lung]
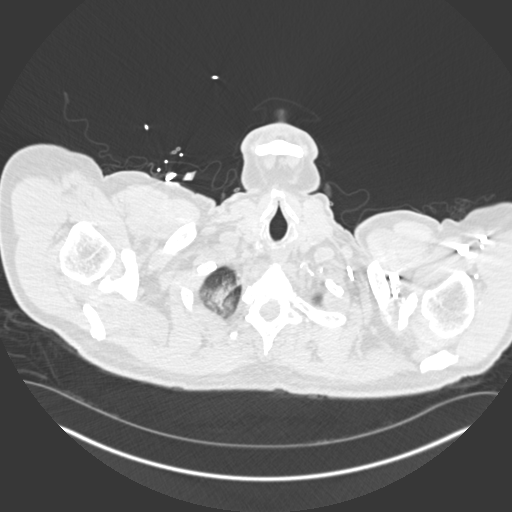
[im 292/306  soft-tissue]
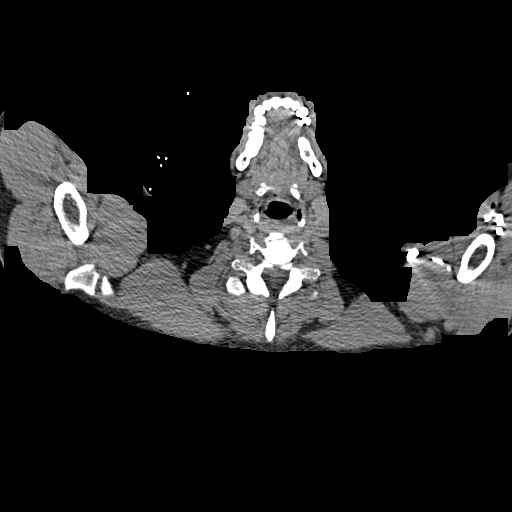

[Series 6: coronal mpr · coronal · 0.67mm/px · 2 of 87 slices shown]
[im 29/87  soft-tissue]
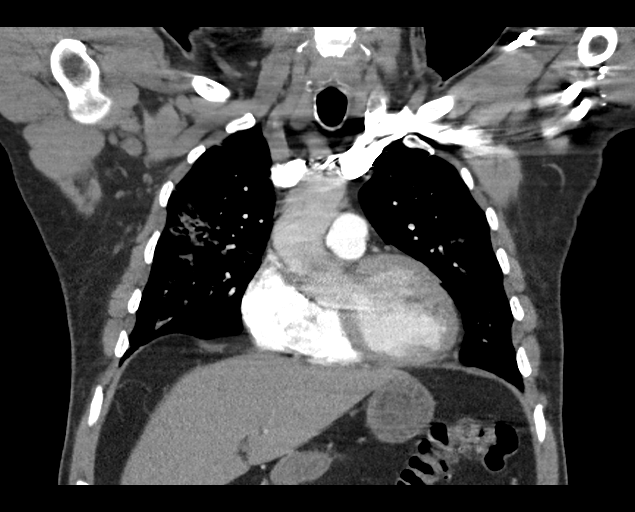
[im 58/87  soft-tissue]
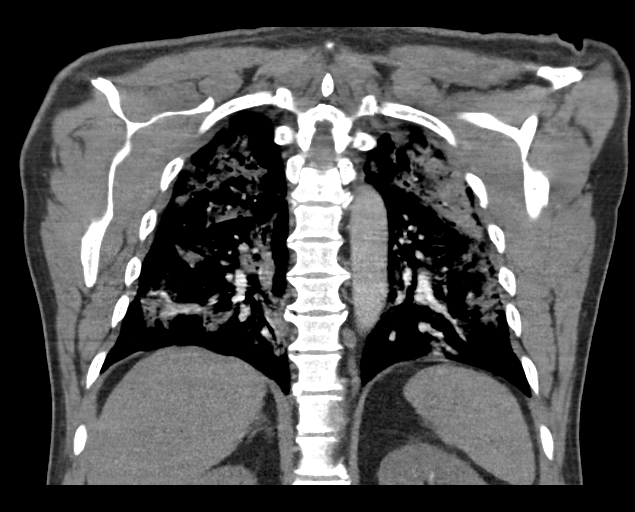

[18 of 46 positions shown; findings below may reference images not displayed]

FINDINGS: Cardiovascular: Good contrast bolus timing in the pulmonary arterial
tree. Respiratory motion. No central or hilar pulmonary artery
filling defect. The bilateral segmental pulmonary arteries also
appear to be normal. But distal branch detail is degraded by motion.

Cardiac size at the upper limits of normal. No pericardial effusion.
No calcified coronary artery atherosclerosis is evident. Little
contrast in the aorta.

Mediastinum/Nodes: Borderline to mild reactive appearing mediastinal
and hilar lymph nodes. Possible small gastric hiatal hernia.

Lungs/Pleura: Major airways remain patent.

There is widespread and fairly symmetric bilateral peribronchial and
peripheral pulmonary opacity ranging from ground-glass to early
consolidation with air bronchograms. The lower lobe superior
segments are among the worst affected. No superimposed pleural
effusion.

Upper Abdomen: Negative visible liver, gallbladder, spleen,
pancreas, adrenal glands, kidneys and bowel in the upper abdomen.

Musculoskeletal: No acute osseous abnormality identified.

Review of the MIP images confirms the above findings.
IMPRESSION: 1. Positive for widespread bilateral pneumonia with a viral/atypical
etiology favored. No pleural effusion.
Assuming negative for COVID, other differential considerations
include mycoplasma, PCP, also endemic fungi.

2. No acute pulmonary embolus identified, although detail of
subsegmental and distal branches is suboptimal.

3. Borderline to mild reactive appearing mediastinal and hilar lymph
nodes.

## 2022-04-10 ENCOUNTER — Encounter: Payer: Self-pay | Admitting: Internal Medicine

## 2022-04-10 ENCOUNTER — Ambulatory Visit: Payer: Self-pay | Admitting: Internal Medicine

## 2022-04-10 VITALS — BP 130/80 | HR 64 | Resp 16 | Ht 63.0 in | Wt 174.0 lb

## 2022-04-10 DIAGNOSIS — R7303 Prediabetes: Secondary | ICD-10-CM

## 2022-04-10 DIAGNOSIS — E785 Hyperlipidemia, unspecified: Secondary | ICD-10-CM

## 2022-04-10 DIAGNOSIS — Z0000 Encounter for general adult medical examination without abnormal findings: Secondary | ICD-10-CM

## 2022-04-10 NOTE — Progress Notes (Signed)
Subjective:    Patient ID: Dennis Booth, male   DOB: [birth year], 57 y.o.   MRN: [id number]   HPI  Here for Male CPE:  1.  STE:   Does perform monthly.  No findings. No family history of testicular cancer.    2.  PSA: Not performed with recent labs.  Normal in 2019.    3.  Guaiac Cards/FIT:  Last performed 04/18/2021 and negative for blood.    4.  Colonoscopy:   Performed 11/2017 and negative for polyps.  + diverticulosis and hemorrhoids.  Dr. Penelope Coop.  Next in 2029.    5.  Cholesterol/Glucose:  Prediabetes with last A1C at 6.0% in August, stable.  History of dyslipidemia.    6.  Immunizations:  No influenza or COVID vaccine this past fall.   Immunization History  Administered Date(s) Administered   Influenza Inj Mdck Quad With Preservative 01/22/2020   Influenza,inj,Quad PF,6+ Mos 12/14/2017   Moderna Covid-19 Vaccine Bivalent Booster 14yr & up 04/04/2021   Moderna SARS-COV2 Booster Vaccination 02/11/2020, 07/22/2020   Moderna Sars-Covid-2 Vaccination 07/14/2019, 08/12/2019   Tdap 09/15/2015   Zoster Recombinat (Shingrix) 04/04/2021, 10/03/2021     Current Meds  Medication Sig   clobetasol (TEMOVATE) 0.05 % external solution Apply 1 Application topically 2 (two) times daily.   loratadine-pseudoephedrine (CLARITIN-D 24-HOUR) 10-240 MG 24 hr tablet Take 1 tablet by mouth daily.   tadalafil (CIALIS) 10 MG tablet TAKE 1 TABLET BY MOUTH AT LEAST 30 MINUTES BEFORE ACTIVITY EVERY OTHER DAY, MAX MAY INCREASE TO 2 TABLETS BY MOUTH EVERY OTHER DAY AS NEEDED   [DISCONTINUED] loratadine (CLARITIN) 10 MG tablet Take 1 tablet (10 mg total) by mouth daily.   Allergies  Allergen Reactions   Fruit & Vegetable Daily [Nutritional Supplements] Other (See Comments)    States when he eats any type of fruit and takes Tylenol at same time, gets irritation in his throat--drinks milk and gets better, but was prescribed Epipen for this in past--not clear why.   Past Medical History:   Diagnosis Date   Atopic dermatitis    Previously followed by Dr. Amy JMartinique  Tacrolimu 1% bid and Fluocinonide 0.05% bid, Claritin for itch   Diverticulosis 11/27/2017   Dr. GPenelope Coop [facility] on diagnostic colonoscopy for rectal bleeding.   Dyslipidemia    Environmental and seasonal allergies    Internal hemorrhoids 11/27/2017   Dr. GPenelope Coopon diagnostic colonoscopy for rectal bleeding.   Obesity (BMI 30-39.9)    Prediabetes 2013   Past Surgical History:  Procedure Laterality Date   COLONOSCOPY WITH PROPOFOL N/A 11/27/2017   Procedure: COLONOSCOPY WITH PROPOFOL;  Surgeon: GWonda Horner MD;  Location: [facility];  Service: Endoscopy;  Laterality: N/A;   Family History  Problem Relation Age of Onset   Diabetes Mother        associated peripheral neuropathy   Hypertension Mother    Kidney disease Mother        Diabetic CKD   Hypothyroidism Mother    Diabetes Father    Other Daughter        Right brachioplexus injury from shoulder dystocia at birth.   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  Tobacco Use   Smoking status:  Never    Passive exposure: Never   Smokeless tobacco: Never  Vaping Use   Vaping Use: Never used  Substance and Sexual Activity   Alcohol use: Yes    Alcohol/week: 0.0 standard drinks of alcohol    Comment: Beer occasionally   Drug use: No   Sexual activity: Yes    Birth control/protection: Coitus interruptus, Condom  Other Topics Concern   Not on file  Social History Narrative      [place]   Lives with his wife, Jerelene Redden and her daughter.  Vilma is also a patient at [facility].   Children still at home live with their mother here in [place].   His children are at his home every week.    Social Determinants of Health   Financial Resource Strain: Low Risk  (04/10/2022)   Overall Financial Resource Strain (CARDIA)    Difficulty of Paying Living Expenses: Not hard at all  Food Insecurity: No Food Insecurity (04/10/2022)   Hunger Vital Sign    Worried About Running Out of Food in the Last Year: Never true    Ran Out of Food in the Last Year: Never true  Transportation Needs: No Transportation Needs (04/10/2022)   PRAPARE - Hydrologist (Medical): No    Lack of Transportation (Non-Medical): No  Physical Activity: Not on file  Stress: Not on file  Social Connections: Moderately Integrated (12/13/2017)   Social Connection and Isolation Panel [NHANES]    Frequency of Communication with Friends and Family: More than three times a week    Frequency of Social Gatherings with Friends and Family: More than three times a week    Attends Religious Services: More than 4 times per year    Active Member of [employer] or Organizations: No    Attends Archivist Meetings: Never    Marital Status: Married  Human resources officer Violence: Not At Risk (04/10/2022)   Humiliation, Afraid, Rape, and Kick questionnaire    Fear of Current or Ex-Partner: No    Emotionally Abused: No    Physically Abused: No    Sexually Abused: No      Review of Systems  HENT:  Negative for dental problem (Has had appt with dental.  Also, has upcoming appt for cleaning.).   Eyes:  Negative for visual disturbance (Wears glasses--prescription is good for him.).  Respiratory:  Negative for shortness of breath.   Cardiovascular:  Negative for chest pain, palpitations and leg swelling.  Gastrointestinal:  Negative for abdominal pain and blood in stool (No melena.).  Neurological:  Negative for weakness and numbness.  Psychiatric/Behavioral:  Negative for dysphoric mood (Is concerned for his wife, Russella Dar, who started worrying about things again last Friday.). The patient is not  nervous/anxious.       Objective:   BP 130/80 (BP Location: Right Arm, Patient Position: Sitting, Cuff Size: Normal)   Pulse 64   Resp 16   Ht '5\' 3"'$  (1.6 m)   Wt 174 lb (78.9 kg)   BMI 30.82 kg/m   Physical Exam Constitutional:      Appearance: Normal appearance.  HENT:     Head: Normocephalic and atraumatic.     Right Ear: Tympanic membrane, ear canal and external ear normal.     Left Ear: Tympanic membrane, ear canal and external ear normal.     Nose: Nose normal.     Mouth/Throat:     Mouth: Mucous membranes are  moist.     Pharynx: Oropharynx is clear.  Eyes:     Extraocular Movements: Extraocular movements intact.     Conjunctiva/sclera: Conjunctivae normal.     Pupils: Pupils are equal, round, and reactive to light.     Comments: Discs sharp  Neck:     Thyroid: No thyroid mass or thyromegaly.  Cardiovascular:     Rate and Rhythm: Normal rate and regular rhythm.     Heart sounds: S1 normal and S2 normal. No murmur heard.    No friction rub. No S3 or S4 sounds.     Comments: No carotid bruits.  Carotid, radial, femoral, DP and PT pulses normal and equal.   Pulmonary:     Effort: Pulmonary effort is normal.     Breath sounds: Normal breath sounds.  Abdominal:     General: Bowel sounds are normal.     Palpations: Abdomen is soft. There is no hepatomegaly, splenomegaly or mass.     Tenderness: There is no abdominal tenderness.     Hernia: No hernia is present. There is no hernia in the left inguinal area or right inguinal area.  Genitourinary:    Penis: Normal.      Testes:        Right: Mass, tenderness or varicocele not present. Right testis is descended.        Left: Varicocele present. Mass or tenderness not present. Left testis is descended.     Epididymis:     Right: Normal.     Left: Normal.  Musculoskeletal:        General: Normal range of motion.     Cervical back: Normal range of motion and neck supple.     Right lower leg: No edema.     Left lower  leg: No edema.  Lymphadenopathy:     Head:     Right side of head: No submental or submandibular adenopathy.     Left side of head: No submental or submandibular adenopathy.     Cervical: No cervical adenopathy.     Upper Body:     Right upper body: No supraclavicular adenopathy.     Left upper body: No supraclavicular adenopathy.     Lower Body: No right inguinal adenopathy. No left inguinal adenopathy.  Skin:    General: Skin is warm.     Capillary Refill: Capillary refill takes less than 2 seconds.     Findings: No rash.  Neurological:     General: No focal deficit present.     Mental Status: He is alert and oriented to person, place, and time.     Cranial Nerves: Cranial nerves 2-12 are intact.     Sensory: Sensation is intact.     Motor: Motor function is intact.     Coordination: Coordination is intact.     Gait: Gait is intact.     Deep Tendon Reflexes: Reflexes are normal and symmetric.  Psychiatric:        Mood and Affect: Mood normal.        Speech: Speech normal.        Behavior: Behavior normal. Behavior is cooperative.        Thought Content: Thought content normal.      Assessment & Plan   CPE FIT in 2 weeks.   Fasting labs in next week --to consider COVID booster if in stock.  FLP, CBC, CMP, A1C, PSA.  2.  Prediabetes/dyslipidemia;  labs as above.    3.  Concerns  for wife:  she needs to call and discuss with her counselor

## 2022-04-18 ENCOUNTER — Other Ambulatory Visit: Payer: Self-pay

## 2022-04-18 DIAGNOSIS — Z0000 Encounter for general adult medical examination without abnormal findings: Secondary | ICD-10-CM

## 2022-04-18 DIAGNOSIS — Z1211 Encounter for screening for malignant neoplasm of colon: Secondary | ICD-10-CM

## 2022-04-18 DIAGNOSIS — N529 Male erectile dysfunction, unspecified: Secondary | ICD-10-CM

## 2022-04-18 DIAGNOSIS — E785 Hyperlipidemia, unspecified: Secondary | ICD-10-CM

## 2022-04-18 DIAGNOSIS — R7303 Prediabetes: Secondary | ICD-10-CM

## 2022-04-18 LAB — POC FIT TEST STOOL: Fecal Occult Blood: NEGATIVE

## 2022-04-19 LAB — CBC WITH DIFFERENTIAL/PLATELET
Basophils Absolute: 0 10*3/uL (ref 0.0–0.2)
Basos: 1 %
EOS (ABSOLUTE): 0.2 10*3/uL (ref 0.0–0.4)
Eos: 4 %
Hematocrit: 43.5 % (ref 37.5–51.0)
Hemoglobin: 14.6 g/dL (ref 13.0–17.7)
Immature Grans (Abs): 0 10*3/uL (ref 0.0–0.1)
Immature Granulocytes: 1 %
Lymphocytes Absolute: 2.4 10*3/uL (ref 0.7–3.1)
Lymphs: 43 %
MCH: 31.3 pg (ref 26.6–33.0)
MCHC: 33.6 g/dL (ref 31.5–35.7)
MCV: 93 fL (ref 79–97)
Monocytes Absolute: 0.4 10*3/uL (ref 0.1–0.9)
Monocytes: 7 %
Neutrophils Absolute: 2.4 10*3/uL (ref 1.4–7.0)
Neutrophils: 44 %
Platelets: 217 10*3/uL (ref 150–450)
RBC: 4.67 x10E6/uL (ref 4.14–5.80)
RDW: 13 % (ref 11.6–15.4)
WBC: 5.5 10*3/uL (ref 3.4–10.8)

## 2022-04-19 LAB — COMPREHENSIVE METABOLIC PANEL
ALT: 26 IU/L (ref 0–44)
AST: 15 IU/L (ref 0–40)
Albumin/Globulin Ratio: 1.4 (ref 1.2–2.2)
Albumin: 4.3 g/dL (ref 3.8–4.9)
Alkaline Phosphatase: 92 IU/L (ref 44–121)
BUN/Creatinine Ratio: 14 (ref 9–20)
BUN: 10 mg/dL (ref 6–24)
Bilirubin Total: 0.4 mg/dL (ref 0.0–1.2)
CO2: 23 mmol/L (ref 20–29)
Calcium: 9.3 mg/dL (ref 8.7–10.2)
Chloride: 103 mmol/L (ref 96–106)
Creatinine, Ser: 0.7 mg/dL — ABNORMAL LOW (ref 0.76–1.27)
Globulin, Total: 3 g/dL (ref 1.5–4.5)
Glucose: 101 mg/dL — ABNORMAL HIGH (ref 70–99)
Potassium: 4.6 mmol/L (ref 3.5–5.2)
Sodium: 141 mmol/L (ref 134–144)
Total Protein: 7.3 g/dL (ref 6.0–8.5)
eGFR: 109 mL/min/{1.73_m2} (ref >59)

## 2022-04-19 LAB — HEMOGLOBIN A1C
Est. average glucose Bld gHb Est-mCnc: 126 mg/dL
Hgb A1c MFr Bld: 6 % — ABNORMAL HIGH (ref 4.8–5.6)

## 2022-04-19 LAB — LIPID PANEL W/O CHOL/HDL RATIO
Cholesterol, Total: 176 mg/dL (ref 100–199)
HDL: 47 mg/dL (ref >39)
LDL Chol Calc (NIH): 99 mg/dL (ref 0–99)
Triglycerides: 173 mg/dL — ABNORMAL HIGH (ref 0–149)
VLDL Cholesterol Cal: 30 mg/dL (ref 5–40)

## 2022-04-19 LAB — PSA: Prostate Specific Ag, Serum: 1.7 ng/mL (ref 0.0–4.0)

## 2022-06-27 ENCOUNTER — Telehealth: Payer: Self-pay | Admitting: Internal Medicine

## 2022-06-27 NOTE — Telephone Encounter (Signed)
Patient called stating that [facility] pharmacy is giving him a similar medication for Tadafil (Cialis) 10 mg tablet and that it's not working for him as the Tadafil was. He states he is not having a "a good erection" and would like to discuss other options.

## 2022-08-09 ENCOUNTER — Other Ambulatory Visit: Payer: Self-pay

## 2022-08-09 DIAGNOSIS — R7303 Prediabetes: Secondary | ICD-10-CM

## 2022-08-10 LAB — HEMOGLOBIN A1C
Est. average glucose Bld gHb Est-mCnc: 128 mg/dL
Hgb A1c MFr Bld: 6.1 % — ABNORMAL HIGH (ref 4.8–5.6)

## 2022-08-14 ENCOUNTER — Ambulatory Visit: Payer: Self-pay | Admitting: Internal Medicine

## 2022-08-14 ENCOUNTER — Encounter: Payer: Self-pay | Admitting: Internal Medicine

## 2022-08-14 VITALS — BP 138/84 | HR 67 | Resp 20 | Ht 63.0 in | Wt 181.5 lb

## 2022-08-14 DIAGNOSIS — R7303 Prediabetes: Secondary | ICD-10-CM

## 2022-08-14 DIAGNOSIS — J3089 Other allergic rhinitis: Secondary | ICD-10-CM

## 2022-08-14 DIAGNOSIS — N529 Male erectile dysfunction, unspecified: Secondary | ICD-10-CM

## 2022-08-14 MED ORDER — TADALAFIL 10 MG PO TABS: ORAL_TABLET | ORAL | 6 refills | Status: DC

## 2022-08-14 NOTE — Progress Notes (Signed)
    Subjective:    Patient ID: Dennis Booth, male   DOB: 04-28-1965, 57 y.o.   MRN: [id number]   HPI   Prediabetes:  recent A1C up a bit to 6.1%.  Weight up as well.  He states he is not eating any differently and not less active then he was in the spring.    2.  Allergies:  not really using Claritin D recently--only uses when pollen bad.   Plain Claritin does not work well for him.    3.  HM:  has not had current COVID vaccine.  He is interested in obtaining today.    4.  ED:  Cialis works well for him.  He brings up one version of Cialis vs another that works better--not clear if he was given different generics with change in efficacy.    Current Meds  Medication Sig   clobetasol (TEMOVATE) 0.05 % external solution Apply 1 Application topically 2 (two) times daily.   loratadine-pseudoephedrine (CLARITIN-D 24-HOUR) 10-240 MG 24 hr tablet Take 1 tablet by mouth daily.   tadalafil (CIALIS) 10 MG tablet TAKE 1 TABLET BY MOUTH AT LEAST 30 MINUTES BEFORE ACTIVITY EVERY OTHER DAY, MAX MAY INCREASE TO 2 TABLETS BY MOUTH EVERY OTHER DAY AS NEEDED   Allergies  Allergen Reactions   Fruit & Vegetable Daily [Nutritional Supplements] Other (See Comments)    States when he eats any type of fruit and takes Tylenol at same time, gets irritation in his throat--drinks milk and gets better, but was prescribed Epipen for this in past--not clear why.     Review of Systems    Objective:   BP 138/84 (BP Location: Left Arm, Patient Position: Sitting, Cuff Size: Normal)   Pulse 67   Resp 20   Ht 5\' 3"  (1.6 m)   Wt 181 lb 8 oz (82.3 kg)   BMI 32.15 kg/m   Physical Exam Cardiovascular:     Pulses: Normal pulses.     Heart sounds: S1 normal and S2 normal. No murmur heard.    No friction rub. No S3 or S4 sounds.  Pulmonary:     Effort: Pulmonary effort is normal.     Breath sounds: Normal breath sounds and air entry.  Abdominal:     General: Bowel sounds are normal.     Palpations:  Abdomen is soft.  Musculoskeletal:     Cervical back: Normal range of motion and neck supple.     Right lower leg: No edema.     Left lower leg: No edema.  Lymphadenopathy:     Cervical: No cervical adenopathy.      Assessment & Plan   Prediabetes and weight gain:  to reassess lifestyle habits and see if can improve diet/physical activity to get weight back down.    Follow up before CPE in March  2.  Allergies:  discussed the D in Claritin D can cause elevations in BP and he is at high normal with BP.  He is currently not using the med, but discussed his BP is borderline high.  Again, to twork on lifestyle issues.    3.  ED:  encouraged him to look at his meds before purchasing to be certain he is getting the generic Cialis that works for him.

## 2023-02-09 ENCOUNTER — Other Ambulatory Visit: Payer: Self-pay

## 2023-02-09 DIAGNOSIS — N529 Male erectile dysfunction, unspecified: Secondary | ICD-10-CM

## 2023-02-09 DIAGNOSIS — Z0000 Encounter for general adult medical examination without abnormal findings: Secondary | ICD-10-CM

## 2023-02-09 DIAGNOSIS — R7303 Prediabetes: Secondary | ICD-10-CM

## 2023-02-09 DIAGNOSIS — E785 Hyperlipidemia, unspecified: Secondary | ICD-10-CM

## 2023-02-10 LAB — CBC WITH DIFFERENTIAL/PLATELET
Basophils Absolute: 0 10*3/uL (ref 0.0–0.2)
Basos: 1 %
EOS (ABSOLUTE): 0.1 10*3/uL (ref 0.0–0.4)
Eos: 2 %
Hematocrit: 44 % (ref 37.5–51.0)
Hemoglobin: 14.4 g/dL (ref 13.0–17.7)
Immature Grans (Abs): 0.1 10*3/uL (ref 0.0–0.1)
Immature Granulocytes: 1 %
Lymphocytes Absolute: 2.2 10*3/uL (ref 0.7–3.1)
Lymphs: 37 %
MCH: 31.2 pg (ref 26.6–33.0)
MCHC: 32.7 g/dL (ref 31.5–35.7)
MCV: 95 fL (ref 79–97)
Monocytes Absolute: 0.5 10*3/uL (ref 0.1–0.9)
Monocytes: 8 %
Neutrophils Absolute: 3 10*3/uL (ref 1.4–7.0)
Neutrophils: 51 %
Platelets: 224 10*3/uL (ref 150–450)
RBC: 4.62 x10E6/uL (ref 4.14–5.80)
RDW: 13.1 % (ref 11.6–15.4)
WBC: 5.8 10*3/uL (ref 3.4–10.8)

## 2023-02-10 LAB — COMPREHENSIVE METABOLIC PANEL
ALT: 19 [IU]/L (ref 0–44)
AST: 11 [IU]/L (ref 0–40)
Albumin: 4.4 g/dL (ref 3.8–4.9)
Alkaline Phosphatase: 82 [IU]/L (ref 44–121)
BUN/Creatinine Ratio: 15 (ref 9–20)
BUN: 12 mg/dL (ref 6–24)
Bilirubin Total: 0.3 mg/dL (ref 0.0–1.2)
CO2: 24 mmol/L (ref 20–29)
Calcium: 9.2 mg/dL (ref 8.7–10.2)
Chloride: 103 mmol/L (ref 96–106)
Creatinine, Ser: 0.79 mg/dL (ref 0.76–1.27)
Globulin, Total: 2.5 g/dL (ref 1.5–4.5)
Glucose: 104 mg/dL — ABNORMAL HIGH (ref 70–99)
Potassium: 4.3 mmol/L (ref 3.5–5.2)
Sodium: 141 mmol/L (ref 134–144)
Total Protein: 6.9 g/dL (ref 6.0–8.5)
eGFR: 104 mL/min/{1.73_m2} (ref >59)

## 2023-02-10 LAB — PSA: Prostate Specific Ag, Serum: 2.3 ng/mL (ref 0.0–4.0)

## 2023-02-10 LAB — TSH: TSH: 1.36 u[IU]/mL (ref 0.450–4.500)

## 2023-02-10 LAB — LIPID PANEL W/O CHOL/HDL RATIO
Cholesterol, Total: 193 mg/dL (ref 100–199)
HDL: 43 mg/dL (ref >39)
LDL Chol Calc (NIH): 105 mg/dL — ABNORMAL HIGH (ref 0–99)
Triglycerides: 264 mg/dL — ABNORMAL HIGH (ref 0–149)
VLDL Cholesterol Cal: 45 mg/dL — ABNORMAL HIGH (ref 5–40)

## 2023-02-10 LAB — HEMOGLOBIN A1C
Est. average glucose Bld gHb Est-mCnc: 126 mg/dL
Hgb A1c MFr Bld: 6 % — ABNORMAL HIGH (ref 4.8–5.6)

## 2023-02-13 ENCOUNTER — Ambulatory Visit: Payer: Self-pay | Admitting: Internal Medicine

## 2023-02-13 ENCOUNTER — Encounter: Payer: Self-pay | Admitting: Internal Medicine

## 2023-02-13 VITALS — BP 130/84 | HR 72 | Ht 63.25 in | Wt 174.0 lb

## 2023-02-13 DIAGNOSIS — Z0000 Encounter for general adult medical examination without abnormal findings: Secondary | ICD-10-CM

## 2023-02-13 DIAGNOSIS — Z23 Encounter for immunization: Secondary | ICD-10-CM

## 2023-02-13 DIAGNOSIS — R7303 Prediabetes: Secondary | ICD-10-CM

## 2023-02-13 DIAGNOSIS — E785 Hyperlipidemia, unspecified: Secondary | ICD-10-CM

## 2023-02-13 MED ORDER — TADALAFIL 10 MG PO TABS: ORAL_TABLET | ORAL | 6 refills | Status: DC

## 2023-02-13 NOTE — Progress Notes (Signed)
 Subjective:    Patient ID: Dennis Booth, male   DOB: 15-Jul-1965, 58 y.o.   MRN: [id number]   HPI  Here for Male CPE:  1.  STE:  He does perform.  No concerning findings.  No family history of testicular cancer.  2.  PSA:  2.3 on the 3rd of January.  Normal range.  No family history of prostate cancer  3.  Guaiac Cards/FIT:  Last 04/18/2022 and negative.    4.  Colonoscopy: 11/27/2017 with Dr. Lennard, GI.  No polyps.  Diverticulosis of sigmoid colon and internal hemorrhoids.    5.  Cholesterol/Glucose:  Cholesterol climbing, with elevated triglycerides.  A1C at 6.0%, which is fairly stable for his prediabetes.   Lipid Panel     Component Value Date/Time   CHOL 193 02/09/2023 0825   TRIG 264 (H) 02/09/2023 0825   HDL 43 02/09/2023 0825   LDLCALC 105 (H) 02/09/2023 0825   LABVLDL 45 (H) 02/09/2023 0825     6.  Immunizations: Has not had influenza nor COVID vaccinations this year. Immunization History  Administered Date(s) Administered   Influenza Inj Mdck Quad With Preservative 01/22/2020   Influenza,inj,Quad PF,6+ Mos 12/14/2017   Moderna Covid-19 Vaccine Bivalent Booster 45yrs & up 04/04/2021   Moderna SARS-COV2 Booster Vaccination 02/11/2020, 07/22/2020   Moderna Sars-Covid-2 Vaccination 07/14/2019, 08/12/2019   Tdap 09/15/2015   Zoster Recombinant(Shingrix ) 04/04/2021, 10/03/2021     Current Meds  Medication Sig   clobetasol  (TEMOVATE ) 0.05 % external solution Apply 1 Application topically 2 (two) times daily.   loratadine -pseudoephedrine (CLARITIN -D 24-HOUR) 10-240 MG 24 hr tablet Take 1 tablet by mouth daily.   tadalafil  (CIALIS ) 10 MG tablet TAKE 1 TABLET BY MOUTH AT LEAST 30 MINUTES BEFORE ACTIVITY EVERY OTHER DAY, MAX MAY INCREASE TO 2 TABLETS BY MOUTH EVERY OTHER DAY AS NEEDED   Allergies  Allergen Reactions   Fruit & Vegetable Daily [Nutritional Supplements] Other (See Comments)    States when he eats any type of fruit and takes Tylenol  at same  time, gets irritation in his throat--drinks milk and gets better, but was prescribed Epipen  for this in past--not clear why.   Past Medical History:  Diagnosis Date   Atopic dermatitis    Previously followed by Dr. Amy Jordan.  Tacrolimu 1% bid and Fluocinonide 0.05% bid, Claritin  for itch   Diverticulosis 11/27/2017   Dr. Ganem, GI on diagnostic colonoscopy for rectal bleeding.   Dyslipidemia    Environmental and seasonal allergies    Internal hemorrhoids 11/27/2017   Dr. Lennard on diagnostic colonoscopy for rectal bleeding.   Obesity (BMI 30-39.9)    Prediabetes 2013    Past Surgical History:  Procedure Laterality Date   COLONOSCOPY WITH PROPOFOL  N/A 11/27/2017   Procedure: COLONOSCOPY WITH PROPOFOL ;  Surgeon: Lennard Lesta FALCON, MD;  Location: [facility];  Service: Endoscopy;  Laterality: N/A;   Family History  Problem Relation Age of Onset   Diabetes Mother        associated peripheral neuropathy   Hypertension Mother    Kidney disease Mother        Diabetic CKD   Hypothyroidism Mother    Diabetes Father    Other Daughter        Right brachioplexus injury from shoulder dystocia at birth.   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  Tobacco Use   Smoking status: Never    Passive exposure: Never   Smokeless tobacco: Never  Vaping Use   Vaping status: Never Used  Substance and Sexual Activity   Alcohol use: Yes    Alcohol/week: 0.0 standard drinks of alcohol    Comment: Beer occasionally   Drug use: No   Sexual activity: Yes    Birth control/protection: Coitus interruptus, Condom  Other Topics Concern   Not on file  Social History Narrative      [place]   Lives with his wife, Nicholaus Dyanna everitt Sherrlyn and their  daughter.  Vilma is also a patient at [facility].   Children still at home live with their mother here in [place].   His children are at his home every week.   Social Drivers of Corporate Investment Banker Strain: Low Risk  (02/13/2023)   Overall Financial Resource Strain (CARDIA)    Difficulty of Paying Living Expenses: Not hard at all  Food Insecurity: No Food Insecurity (02/13/2023)   Hunger Vital Sign    Worried About Running Out of Food in the Last Year: Never true    Ran Out of Food in the Last Year: Never true  Transportation Needs: No Transportation Needs (02/13/2023)   PRAPARE - Administrator, Civil Service (Medical): No    Lack of Transportation (Non-Medical): No  Physical Activity: Not on file  Stress: Not on file  Social Connections: Moderately Integrated (12/13/2017)   Social Connection and Isolation Panel [NHANES]    Frequency of Communication with Friends and Family: More than three times a week    Frequency of Social Gatherings with Friends and Family: More than three times a week    Attends Religious Services: More than 4 times per year    Active Member of [employer] or Organizations: No    Attends Banker Meetings: Never    Marital Status: Married  Catering Manager Violence: Not At Risk (02/13/2023)   Humiliation, Afraid, Rape, and Kick questionnaire    Fear of Current or Ex-Partner: No    Emotionally Abused: No    Physically Abused: No    Sexually Abused: No     Review of Systems  HENT:  Negative for dental problem (Has appt with dental clinic set up.  No concerns.).   Eyes:  Negative for visual disturbance (Wears glasses and went to [facility] this past year.).  Respiratory:  Negative for shortness of breath.   Cardiovascular:  Negative for chest pain, palpitations and leg swelling.  Gastrointestinal:  Negative for abdominal pain and blood in stool (No melena).  Genitourinary:  Negative for decreased urine volume.       Cialis  works  well for his ED.  Neurological:  Negative for weakness and numbness.  Psychiatric/Behavioral:  Negative for dysphoric mood. The patient is not nervous/anxious.       Objective:   BP 130/84 (BP Location: Left Arm, Patient Position: Sitting, Cuff Size: Normal)   Pulse 72   Ht 5' 3.25 (1.607 m)   Wt 174 lb (78.9 kg)   BMI 30.58 kg/m   Physical Exam HENT:     Head: Normocephalic and atraumatic.     Right Ear: Tympanic membrane, ear canal and external ear normal.     Left Ear: Tympanic membrane, ear canal and external ear normal.  Nose: Nose normal.     Mouth/Throat:     Mouth: Mucous membranes are moist.     Pharynx: Oropharynx is clear.  Eyes:     Extraocular Movements: Extraocular movements intact.     Conjunctiva/sclera: Conjunctivae normal.     Pupils: Pupils are equal, round, and reactive to light.     Comments: Discs sharp  Neck:     Thyroid: No thyroid mass or thyromegaly.  Cardiovascular:     Rate and Rhythm: Normal rate and regular rhythm.     Heart sounds: S1 normal and S2 normal. No murmur heard.    No friction rub. No S3 or S4 sounds.     Comments: No carotid bruits.  Carotid, radial, femoral, DP and PT pulses normal and equal.   Pulmonary:     Effort: Pulmonary effort is normal.     Breath sounds: Normal breath sounds and air entry.  Abdominal:     General: Bowel sounds are normal.     Palpations: Abdomen is soft. There is no hepatomegaly, splenomegaly or mass.     Tenderness: There is no abdominal tenderness.     Hernia: No hernia is present.  Genitourinary:    Penis: Normal and circumcised.      Testes:        Right: Mass or tenderness not present. Right testis is descended.        Left: Mass or tenderness not present. Left testis is descended.  Musculoskeletal:        General: Normal range of motion.     Cervical back: Normal range of motion and neck supple.     Right lower leg: No edema.     Left lower leg: No edema.  Feet:     Right foot:      Skin integrity: Skin integrity normal.     Left foot:     Skin integrity: Skin integrity normal.  Lymphadenopathy:     Head:     Right side of head: No submental or submandibular adenopathy.     Left side of head: No submental or submandibular adenopathy.     Cervical: No cervical adenopathy.     Upper Body:     Right upper body: No supraclavicular adenopathy.     Left upper body: No supraclavicular adenopathy.     Lower Body: No right inguinal adenopathy. No left inguinal adenopathy.  Skin:    General: Skin is warm.     Capillary Refill: Capillary refill takes less than 2 seconds.  Neurological:     General: No focal deficit present.     Mental Status: He is alert and oriented to person, place, and time.     Cranial Nerves: Cranial nerves 2-12 are intact.     Sensory: Sensation is intact.     Motor: Motor function is intact.     Coordination: Coordination is intact.     Gait: Gait is intact.     Deep Tendon Reflexes: Reflexes are normal and symmetric.  Psychiatric:        Mood and Affect: Mood normal.        Speech: Speech normal.        Behavior: Behavior normal. Behavior is cooperative.      Assessment & Plan   CPE FIT to return in 2 weeks Influenza Encouraged obtaining COVID  2.  Prediabetes:  stable A1C  3.  Dyslipidemia:  cholesterol gradually increasing.   Encouraged working on diet and physical activity.  4.  ED:  fine with Cialis 

## 2023-02-13 NOTE — Addendum Note (Signed)
 Addended by: Marcene Duos on: 02/13/2023 09:44 AM   Modules accepted: Orders

## 2023-03-07 ENCOUNTER — Other Ambulatory Visit (INDEPENDENT_AMBULATORY_CARE_PROVIDER_SITE_OTHER): Payer: Self-pay

## 2023-03-07 DIAGNOSIS — Z1211 Encounter for screening for malignant neoplasm of colon: Secondary | ICD-10-CM

## 2023-03-07 LAB — POC FIT TEST STOOL: Fecal Occult Blood: NEGATIVE

## 2023-08-13 ENCOUNTER — Other Ambulatory Visit: Payer: Self-pay | Admitting: Internal Medicine

## 2023-08-13 DIAGNOSIS — E785 Hyperlipidemia, unspecified: Secondary | ICD-10-CM

## 2023-08-14 LAB — LIPID PANEL W/O CHOL/HDL RATIO
Cholesterol, Total: 159 mg/dL (ref 100–199)
HDL: 42 mg/dL (ref >39)
LDL Chol Calc (NIH): 83 mg/dL (ref 0–99)
Triglycerides: 201 mg/dL — ABNORMAL HIGH (ref 0–149)
VLDL Cholesterol Cal: 34 mg/dL (ref 5–40)

## 2023-08-16 ENCOUNTER — Encounter: Payer: Self-pay | Admitting: Internal Medicine

## 2023-08-16 ENCOUNTER — Ambulatory Visit: Payer: Self-pay | Admitting: Internal Medicine

## 2023-08-16 VITALS — BP 110/70 | HR 64 | Resp 20 | Ht 63.25 in | Wt 179.0 lb

## 2023-08-16 DIAGNOSIS — E785 Hyperlipidemia, unspecified: Secondary | ICD-10-CM

## 2023-08-16 NOTE — Progress Notes (Signed)
     Subjective:    Patient ID: Dennis Booth, male   DOB: [birth year], 58 y.o.   MRN: [id number]   HPI  Dennis Booth interprets   Dyslipidemia:  Discussed total and LDL much improved and trigs better since checked in January.  He is not paying attention to how far or long he is walking now, but doing daily and discussed has made a difference.  He has made some dietary changes--eating more veggies and decreased meat.  Discussed foods with good fats --to increase in diet.    Current Meds  Medication Sig   clobetasol  (TEMOVATE ) 0.05 % external solution Apply 1 Application topically 2 (two) times daily.   loratadine -pseudoephedrine (CLARITIN -D 24-HOUR) 10-240 MG 24 hr tablet Take 1 tablet by mouth daily.   tadalafil  (CIALIS ) 10 MG tablet TAKE 1 TABLET BY MOUTH AT LEAST 30 MINUTES BEFORE ACTIVITY EVERY OTHER DAY, MAX MAY INCREASE TO 2 TABLETS BY MOUTH EVERY OTHER DAY AS NEEDED   Allergies  Allergen Reactions   Fruit & Vegetable Daily [Nutritional Supplements] Other (See Comments)    States when he eats any type of fruit and takes Tylenol  at same time, gets irritation in his throat--drinks milk and gets better, but was prescribed Epipen  for this in past--not clear why.     Review of Systems    Objective:   Vitals:   08/16/23 1615  BP: 110/70  Pulse: 64  Resp: 20  Height: 5' 3.25 (1.607 m)  Weight: 179 lb (81.2 kg)  BMI (Calculated): 31.44     Physical Exam NAD Lungs:  CTA CV:  RRR without murmur or rub.  Radial pulses normal and equal   Assessment & Plan  Dyslipidemia:  much improved.  To continue to work on diet and physical activity.

## 2023-10-03 ENCOUNTER — Telehealth: Payer: Self-pay | Admitting: Internal Medicine

## 2023-10-03 NOTE — Telephone Encounter (Signed)
 Patient called and states he would like to know if we can do labs for him.  Patient states the labs were requested from a doctor in [place] who will be sending him a natural treatment for erectile disfunsion,   Labs requested are   To check Cholesterol Uric Acid Creatinine  Triglycerides Urea Lab  Glucose

## 2023-10-09 NOTE — Telephone Encounter (Signed)
 Labs printed.Patient contacted for pickup.

## 2023-10-09 NOTE — Telephone Encounter (Signed)
 Patient has been notified

## 2023-10-12 NOTE — Telephone Encounter (Signed)
 Patient pick up on 10/11/23.

## 2024-02-29 ENCOUNTER — Other Ambulatory Visit (INDEPENDENT_AMBULATORY_CARE_PROVIDER_SITE_OTHER): Payer: Self-pay

## 2024-02-29 DIAGNOSIS — E785 Hyperlipidemia, unspecified: Secondary | ICD-10-CM

## 2024-02-29 DIAGNOSIS — Z0000 Encounter for general adult medical examination without abnormal findings: Secondary | ICD-10-CM

## 2024-02-29 DIAGNOSIS — R7303 Prediabetes: Secondary | ICD-10-CM

## 2024-03-01 LAB — COMPREHENSIVE METABOLIC PANEL WITH GFR
ALT: 25 [IU]/L (ref 0–44)
AST: 18 [IU]/L (ref 0–40)
Albumin: 4.2 g/dL (ref 3.8–4.9)
Alkaline Phosphatase: 78 [IU]/L (ref 47–123)
BUN/Creatinine Ratio: 16 (ref 9–20)
BUN: 13 mg/dL (ref 6–24)
Bilirubin Total: 0.6 mg/dL (ref 0.0–1.2)
CO2: 24 mmol/L (ref 20–29)
Calcium: 9 mg/dL (ref 8.7–10.2)
Chloride: 107 mmol/L — ABNORMAL HIGH (ref 96–106)
Creatinine, Ser: 0.81 mg/dL (ref 0.76–1.27)
Globulin, Total: 2.6 g/dL (ref 1.5–4.5)
Glucose: 103 mg/dL — ABNORMAL HIGH (ref 70–99)
Potassium: 4.4 mmol/L (ref 3.5–5.2)
Sodium: 143 mmol/L (ref 134–144)
Total Protein: 6.8 g/dL (ref 6.0–8.5)
eGFR: 103 mL/min/{1.73_m2}

## 2024-03-01 LAB — CBC WITH DIFFERENTIAL/PLATELET
Basophils Absolute: 0 10*3/uL (ref 0.0–0.2)
Basos: 1 %
EOS (ABSOLUTE): 0.1 10*3/uL (ref 0.0–0.4)
Eos: 2 %
Hematocrit: 42.4 % (ref 37.5–51.0)
Hemoglobin: 14.1 g/dL (ref 13.0–17.7)
Immature Grans (Abs): 0 10*3/uL (ref 0.0–0.1)
Immature Granulocytes: 0 %
Lymphocytes Absolute: 2.1 10*3/uL (ref 0.7–3.1)
Lymphs: 42 %
MCH: 31.9 pg (ref 26.6–33.0)
MCHC: 33.3 g/dL (ref 31.5–35.7)
MCV: 96 fL (ref 79–97)
Monocytes Absolute: 0.4 10*3/uL (ref 0.1–0.9)
Monocytes: 9 %
Neutrophils Absolute: 2.3 10*3/uL (ref 1.4–7.0)
Neutrophils: 46 %
Platelets: 196 10*3/uL (ref 150–450)
RBC: 4.42 x10E6/uL (ref 4.14–5.80)
RDW: 13.1 % (ref 11.6–15.4)
WBC: 4.9 10*3/uL (ref 3.4–10.8)

## 2024-03-01 LAB — PSA: Prostate Specific Ag, Serum: 2.2 ng/mL (ref 0.0–4.0)

## 2024-03-01 LAB — HEMOGLOBIN A1C
Est. average glucose Bld gHb Est-mCnc: 123 mg/dL
Hgb A1c MFr Bld: 5.9 % — ABNORMAL HIGH (ref 4.8–5.6)

## 2024-03-01 LAB — LIPID PANEL W/O CHOL/HDL RATIO
Cholesterol, Total: 152 mg/dL (ref 100–199)
HDL: 42 mg/dL
LDL Chol Calc (NIH): 90 mg/dL (ref 0–99)
Triglycerides: 110 mg/dL (ref 0–149)
VLDL Cholesterol Cal: 20 mg/dL (ref 5–40)

## 2024-03-03 ENCOUNTER — Encounter: Payer: Self-pay | Admitting: Internal Medicine

## 2024-03-14 ENCOUNTER — Encounter: Payer: Self-pay | Admitting: Internal Medicine

## 2024-03-14 ENCOUNTER — Ambulatory Visit: Payer: Self-pay | Admitting: Internal Medicine

## 2024-03-14 VITALS — BP 122/70 | HR 68 | Resp 18 | Ht 63.25 in | Wt 180.0 lb

## 2024-03-14 DIAGNOSIS — Z0000 Encounter for general adult medical examination without abnormal findings: Secondary | ICD-10-CM

## 2024-03-14 DIAGNOSIS — R7303 Prediabetes: Secondary | ICD-10-CM

## 2024-03-14 DIAGNOSIS — N529 Male erectile dysfunction, unspecified: Secondary | ICD-10-CM

## 2024-03-14 DIAGNOSIS — E785 Hyperlipidemia, unspecified: Secondary | ICD-10-CM

## 2024-03-14 DIAGNOSIS — Z23 Encounter for immunization: Secondary | ICD-10-CM

## 2024-03-14 MED ORDER — TADALAFIL 10 MG PO TABS: ORAL_TABLET | ORAL | 6 refills | Status: AC

## 2024-03-14 MED ORDER — CLOBETASOL PROPIONATE 0.05 % EX SOLN: 1.0000 | Freq: Two times a day (BID) | CUTANEOUS | 3 refills | Status: AC

## 2024-03-14 NOTE — Patient Instructions (Signed)
 Call for influenza and COvID vaccines in Sept/Oct

## 2024-03-14 NOTE — Progress Notes (Unsigned)
 "   Subjective:    Patient ID: Dennis Booth, male   DOB: Sep 03, 1965, 59 y.o.   MRN: [id number]   HPI  Here for Male CPE:  1.  STE:  Does perform.  No concerns.  No family history of testicular cancer.   2.  PSA:  Last 02/2024 and normal at 2.2.  No family history of prostate cancer.    3.  Guaiac Cards/FIT:  Last 02/2023 and negative for blood.    4.  Colonoscopy:  Last in 2019 and only diverticulosis and hemorrhoids noted.  No polyps.  Dr. Lennard with [facility].   Next due in 2029.  No family history of colon cancer.    5.  Cholesterol/Glucose:  Cholesterol improved and at goal save for slightly low HDL.  A1C and prediabetes improved to 5.9% Lipid Panel     Component Value Date/Time   CHOL 152 02/29/2024 0852   TRIG 110 02/29/2024 0852   HDL 42 02/29/2024 0852   LDLCALC 90 02/29/2024 0852   LABVLDL 20 02/29/2024 0852     6.  Immunizations: Has not had influenza or COVID vaccines this year.  Pneumococcal 20 also due. Immunization History  Administered Date(s) Administered   Influenza Inj Mdck Quad With Preservative 01/22/2020   Influenza, Mdck, Trivalent,PF 6+ MOS(egg free) 02/13/2023   Influenza,inj,Quad PF,6+ Mos 12/14/2017   Moderna Covid-19 Vaccine Bivalent Booster 50yrs & up 04/04/2021   Moderna SARS-COV2 Booster Vaccination 02/11/2020, 07/22/2020   Moderna Sars-Covid-2 Vaccination 07/14/2019, 08/12/2019   Tdap 09/15/2015   Zoster Recombinant(Shingrix ) 04/04/2021, 10/03/2021     Current Meds  Medication Sig   clobetasol  (TEMOVATE ) 0.05 % external solution Apply 1 Application topically 2 (two) times daily.   loratadine -pseudoephedrine (CLARITIN -D 24-HOUR) 10-240 MG 24 hr tablet Take 1 tablet by mouth daily.   tadalafil  (CIALIS ) 10 MG tablet TAKE 1 TABLET BY MOUTH AT LEAST 30 MINUTES BEFORE ACTIVITY EVERY OTHER DAY, MAX MAY INCREASE TO 2 TABLETS BY MOUTH EVERY OTHER DAY AS NEEDED   Allergies  Allergen Reactions   Fruit & Vegetable Daily [Nutritional  Supplements] Other (See Comments)    States when he eats any type of fruit and takes Tylenol  at same time, gets irritation in his throat--drinks milk and gets better, but was prescribed Epipen  for this in past--not clear why.   Past Medical History:  Diagnosis Date   Atopic dermatitis    Previously followed by Dr. Amy Jordan.  Tacrolimu 1% bid and Fluocinonide 0.05% bid, Claritin  for itch   Diverticulosis 11/27/2017   Dr. Ganem, GI on diagnostic colonoscopy for rectal bleeding.   Dyslipidemia    Environmental and seasonal allergies    Internal hemorrhoids 11/27/2017   Dr. Lennard on diagnostic colonoscopy for rectal bleeding.   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  Tobacco Use   Smoking status: Never    Passive exposure: Never   Smokeless tobacco: Never  Vaping Use   Vaping status: Never Used  Substance and Sexual Activity   Alcohol use: Yes    Alcohol/week: 0.0 standard drinks of alcohol    Comment: Beer occasionally   Drug use: No   Sexual activity: Yes    Birth control/protection: Coitus interruptus, Condom  Other Topics Concern   Not on file   Social History Narrative      [place]   Lives with his wife, Nicholaus Dyanna everitt Sherrlyn and their daughter.  Vilma is also a patient at [facility].   Children still at home live with their mother here in [place].   His children are at his home every week.   Social Drivers of Health   Tobacco Use: Low Risk (03/14/2024)   Patient History    Smoking Tobacco Use: Never    Smokeless Tobacco Use: Never    Passive Exposure: Never  Financial Resource Strain: Low Risk (03/14/2024)   Overall Financial Resource Strain (CARDIA)    Difficulty of Paying Living Expenses: Not hard at all  Food Insecurity: No Food Insecurity (03/14/2024)   Epic    Worried About Radiation Protection Practitioner of Food in the Last Year: Never true    Ran Out of Food in the Last Year: Never true  Transportation Needs: No Transportation Needs (03/14/2024)   Epic    Lack of Transportation (Medical): No    Lack of Transportation (Non-Medical): No  Physical Activity: Not on file  Stress: Not on file  Social Connections: Not on file  Intimate Partner Violence: Not At Risk (03/14/2024)   Epic    Fear of Current or Ex-Partner: No    Emotionally Abused: No    Physically Abused: No    Sexually Abused: No  Depression (PHQ2-9): Not on file  Alcohol Screen: Not on file  Housing: Unknown (03/14/2024)   Epic    Unable to Pay for Housing in the Last Year: No    Number of Times Moved in the Last Year: Not on file    Homeless in the Last Year: No  Utilities: Not At Risk (03/14/2024)   Epic    Threatened with loss of utilities: No  Health Literacy: Not on file     Review of Systems  HENT:  Negative for dental problem (Has had dental care this year.  Plans to call the get another visit this year.).   Eyes:  Negative for visual disturbance (fine with glasses.  Last eye check 1 year ago.).  Respiratory:  Negative for shortness of breath.   Cardiovascular:  Negative for chest pain, palpitations and leg swelling.  Gastrointestinal:  Negative  for abdominal pain and blood in stool (No melena).  Neurological:  Positive for numbness (Rare numbness on low lateral left thigh.  If changes positions, resolves). Negative for weakness.      Objective:   BP 122/70 (BP Location: Right Arm, Patient Position: Sitting, Cuff Size: Normal)   Pulse 68   Resp 18   Ht 5' 3.25 (1.607 m)   Wt 180 lb (81.6 kg)   BMI 31.63 kg/m   Physical Exam  Abdominal:     Hernia: There is no hernia in the left inguinal area or right inguinal area.  Genitourinary:    Penis: Normal and circumcised.      Testes:        Right: Tenderness or swelling not present. Right testis is descended.        Left: Tenderness or swelling not present. Left testis is descended.      Assessment & Plan   CPE Spikevax COVID Encouraged influenza vaccine as well, but we are now out. In future to come in in Sept/Oct for both Pneumococcal "

## 2024-04-14 ENCOUNTER — Ambulatory Visit: Payer: Self-pay | Admitting: Internal Medicine

## 2024-07-28 ENCOUNTER — Encounter: Payer: Self-pay | Admitting: Internal Medicine

## 2025-03-13 ENCOUNTER — Other Ambulatory Visit: Payer: Self-pay | Admitting: Internal Medicine

## 2025-03-17 ENCOUNTER — Encounter: Payer: Self-pay | Admitting: Internal Medicine
